# Patient Record
Sex: Female | Born: 1965 | Race: White | Hispanic: No | Marital: Single
Health system: Southern US, Community
[De-identification: ages and names within clinical notes are randomized; demographics above are authoritative.]

---

## 2015-08-06 ENCOUNTER — Encounter: Payer: Self-pay | Admitting: Emergency Medicine

## 2015-08-06 ENCOUNTER — Emergency Department
Admission: EM | Admit: 2015-08-06 | Discharge: 2015-08-06 | Disposition: A | Discharge status: Home / Self Care | Payer: Self-pay | Source: Home / Self Care | Attending: Family Medicine | Admitting: Family Medicine

## 2015-08-06 DIAGNOSIS — S61412A Laceration without foreign body of left hand, initial encounter: Secondary | ICD-10-CM

## 2015-08-06 DIAGNOSIS — Z23 Encounter for immunization: Secondary | ICD-10-CM

## 2015-08-06 MED ORDER — TETANUS-DIPHTH-ACELL PERTUSSIS 5-2.5-18.5 LF-MCG/0.5 IM SUSP
0.5000 mL | Freq: Once | INTRAMUSCULAR | Status: AC
Start: 2015-08-06 — End: 2015-08-06
  Administered 2015-08-06: 0.5 mL via INTRAMUSCULAR

## 2015-08-06 MED ORDER — IBUPROFEN 600 MG PO TABS: 600.0000 mg | ORAL_TABLET | Freq: Four times a day (QID) | ORAL | Status: DC | PRN

## 2015-08-06 MED ORDER — TRAMADOL HCL 50 MG PO TABS: 50.0000 mg | ORAL_TABLET | Freq: Four times a day (QID) | ORAL | Status: DC | PRN

## 2015-08-06 NOTE — ED Provider Notes (Signed)
CSN: [id number]     Arrival date & time 08/06/15  1648 History   First MD Initiated Contact with Patient 08/06/15 1714     Chief Complaint  Patient presents with  . Extremity Laceration   (Consider location/radiation/quality/duration/timing/severity/associated sxs/prior Treatment) HPI  Pt is a 49yo female presenting to [facility] with c/o laceration to her Left hand that occurred just PTA.  Pt states she was cutting some vinyl with a knife.  The knife slipped and she accidentally cut her Left hand.  Pt applied pressure with a towel. Bleeding controlled PTA.  No other injuries.  Pt is Right hand dominant. No pain medication PTA. Pain is aching and stinging, mild to moderate in severity. Tetanus is out of date. She is not on blood thinners.   History reviewed. No pertinent past medical history. History reviewed. No pertinent past surgical history. No family history on file. Social History  Substance Use Topics  . Smoking status: Unknown If Ever Smoked  . Smokeless tobacco: None  . Alcohol Use: None   OB History    No data available     Review of Systems  Musculoskeletal: Negative for myalgias, joint swelling and arthralgias.  Skin: Positive for wound. Negative for color change.       Left hand laceration  Neurological: Negative for weakness and numbness.    Allergies  Review of patient's allergies indicates no known allergies.  Home Medications   Prior to Admission medications   Medication Sig Start Date End Date Taking? Authorizing Provider  ibuprofen (ADVIL,MOTRIN) 600 MG tablet Take 1 tablet (600 mg total) by mouth every 6 (six) hours as needed. 08/06/15   Junius FinnerErin O'Malley, PA-C  traMADol (ULTRAM) 50 MG tablet Take 1 tablet (50 mg total) by mouth every 6 (six) hours as needed. 08/06/15   Junius FinnerErin O'Malley, PA-C   Meds Ordered and Administered this Visit   Medications  Tdap (BOOSTRIX) injection 0.5 mL (0.5 mLs Intramuscular Given 08/06/15 1727)    BP 114/77 mmHg  Pulse 76  Temp(Src)  98.2 F (36.8 C) (Oral)  Ht 5\' 5"  (1.651 m)  Wt 196 lb (88.905 kg)  BMI 32.62 kg/m2  SpO2 97%  LMP  (LMP Unknown) No data found.   Physical Exam  Constitutional: She is oriented to person, place, and time. She appears well-developed and well-nourished.  HENT:  Head: Normocephalic and atraumatic.  Eyes: EOM are normal.  Neck: Normal range of motion.  Cardiovascular: Normal rate.   Pulses:      Radial pulses are 2+ on the left side.  Left thumb: cap refill < 3 seconds  Pulmonary/Chest: Effort normal.  Musculoskeletal: Normal range of motion.  Left hand: laceration base of thumb (see skin exam). Full ROM Left thumb and index finger.   Neurological: She is alert and oriented to person, place, and time.  Left hand: mild numbness around wound edges. Distal sensation in tact.  Skin: Skin is warm and dry.  Left hand, dorsal aspect over 1st metacarpal: 3cm linear laceration with clean edges. Bleeding controlled. Tendon visible under fascia layer. Tendon is in tact.  No foreign bodies seen or palpated.   Psychiatric: She has a normal mood and affect. Her behavior is normal.  Nursing note and vitals reviewed.   ED Course  .Marland Kitchen.Laceration Repair Date/Time: 08/06/2015 5:31 PM Performed by: Junius Finner'MALLEY, Annasophia Crocker Authorized by: Donna ChristenBEESE, STEPHEN A Consent: Verbal consent obtained. Risks and benefits: risks, benefits and alternatives were discussed Consent given by: patient Patient understanding: patient states understanding of the  procedure being performed Patient consent: the patient's understanding of the procedure matches consent given Site marked: the operative site was marked Required items: required blood products, implants, devices, and special equipment available Patient identity confirmed: verbally with patient Time out: Immediately prior to procedure a "time out" was called to verify the correct patient, procedure, equipment, support staff and site/side marked as required. Body area: upper  extremity Location details: left hand Laceration length: 3 cm Foreign bodies: no foreign bodies Tendon involvement: none Nerve involvement: superficial Vascular damage: no Anesthesia: local infiltration Local anesthetic: lidocaine 2% without epinephrine Anesthetic total: 1.5 ml Patient sedated: no Preparation: Patient was prepped and draped in the usual sterile fashion. Irrigation solution: saline (diluted betadine ) Debridement: none Degree of undermining: none Skin closure: 4-0 Prolene Number of sutures: 6 Technique: simple Approximation: close Approximation difficulty: simple Dressing: 4x4 sterile gauze, antibiotic ointment and gauze roll Patient tolerance: Patient tolerated the procedure well with no immediate complications   (including critical care time)  Labs Review Labs Reviewed - No data to display  Imaging Review No results found.    MDM   1. Hand laceration, left, initial encounter    Pt presenting to [facility] with laceration to Left hand. No tendon involvement. Superficial nerve involvement with slight decreased sensation around wound edges. No foreign bodies seen or palpated. No indication for imaging at this time. Wound sutured closed w/o immediate complications Rx: tramadol and ibuprofen F/u in 4-5 days if not improving, sooner if signs of infection F/u in 10-14 days for suture removal if no complications. Patient verbalized understanding and agreement with treatment plan.    Junius Finner, PA-C 08/06/15 1806

## 2015-08-06 NOTE — ED Notes (Signed)
PT CUT HER LEFT THUMB WITH A KNIFE TODAY

## 2015-08-06 NOTE — Discharge Instructions (Signed)
Tramadol is strong pain medication. While taking, do not drink alcohol, drive, or perform any other activities that requires focus while taking these medications.   You should at least take ibuprofen tonight before bed to help prevent pain from waking you in the night as numbing medication will wear off.  Keep hand elevated on a pillow to help with pain and swelling. See below for further instructions.

## 2015-08-18 ENCOUNTER — Emergency Department (INDEPENDENT_AMBULATORY_CARE_PROVIDER_SITE_OTHER)
Admission: EM | Admit: 2015-08-18 | Discharge: 2015-08-18 | Disposition: A | Discharge status: Home / Self Care | Payer: Self-pay | Source: Home / Self Care | Attending: Family Medicine | Admitting: Family Medicine

## 2015-08-18 DIAGNOSIS — Z4802 Encounter for removal of sutures: Secondary | ICD-10-CM

## 2015-08-18 NOTE — ED Provider Notes (Signed)
CSN: [id number]     Arrival date & time 08/18/15  1047 History   First MD Initiated Contact with Patient 08/18/15 1047     Chief Complaint  Patient presents with  . Suture / Staple Removal   (Consider location/radiation/quality/duration/timing/severity/associated sxs/prior Treatment) HPI  Pt is a 49yo female [facility] after she had 6 sutures placed in Left hand on 08/06/15.  Pt initially cut her hand on a knife while cutting vinyl. Pt states wound has been healing well. Denies fever or chills. She notes mild redness at area of stitches as well as mild itching but denies drainage or bleeding from wound. No other concerns.  History reviewed. No pertinent past medical history. History reviewed. No pertinent past surgical history. No family history on file. Social History  Substance Use Topics  . Smoking status: Unknown If Ever Smoked  . Smokeless tobacco: None  . Alcohol Use: None   OB History    No data available     Review of Systems  Constitutional: Negative for fever and chills.  Musculoskeletal: Negative for myalgias, joint swelling and arthralgias.  Skin: Positive for color change and wound. Negative for rash.       Left hand  Neurological: Negative for weakness and numbness.    Allergies  Review of patient's allergies indicates no known allergies.  Home Medications   Prior to Admission medications   Medication Sig Start Date End Date Taking? Authorizing Provider  ibuprofen (ADVIL,MOTRIN) 600 MG tablet Take 1 tablet (600 mg total) by mouth every 6 (six) hours as needed. 08/06/15   Junius FinnerErin O'Malley, PA-C  traMADol (ULTRAM) 50 MG tablet Take 1 tablet (50 mg total) by mouth every 6 (six) hours as needed. 08/06/15   Junius FinnerErin O'Malley, PA-C   Meds Ordered and Administered this Visit  Medications - No data to display  BP 122/78 mmHg  Pulse 73  Temp(Src) 98.3 F (36.8 C) (Oral)  Ht 5\' 5"  (1.651 m)  Wt 196 lb (88.905 kg)  BMI 32.62 kg/m2  SpO2 96%   LMP  (LMP Unknown) No data found.   Physical Exam  Constitutional: She is oriented to person, place, and time. She appears well-developed and well-nourished.  HENT:  Head: Normocephalic and atraumatic.  Eyes: EOM are normal.  Neck: Normal range of motion.  Cardiovascular: Normal rate.   Pulmonary/Chest: Effort normal.  Musculoskeletal: Normal range of motion.  Left hand: full ROM all fingers (see skin exam)  Neurological: She is alert and oriented to person, place, and time.  Skin: Skin is warm and dry. No rash noted. No erythema.  Left hand, dorsal aspect, base of thumb: well healing laceration, 6 interrupted sutures in place.  Pink skin. No bleeding or discharge. Non-tender.  Psychiatric: She has a normal mood and affect. Her behavior is normal.  Nursing note and vitals reviewed.   ED Course  .Suture Removal Date/Time: 08/18/2015 11:22 AM Performed by: Junius Finner'MALLEY, Minnetta Sandora Authorized by: Donna ChristenBEESE, STEPHEN A Consent: Verbal consent obtained. Risks and benefits: risks, benefits and alternatives were discussed Consent given by: patient Patient understanding: patient states understanding of the procedure being performed Site marked: the operative site was marked Required items: required blood products, implants, devices, and special equipment available Patient identity confirmed: verbally with patient Time out: Immediately prior to procedure a "time out" was called to verify the correct patient, procedure, equipment, support staff and site/side marked as required. Body area: upper extremity Location details: left hand Wound Appearance: pink and clean Sutures  Removed: 6 Post-removal: dressing applied and Steri-Strips applied Facility: sutures placed in this facility Patient tolerance: Patient tolerated the procedure well with no immediate complications   (including critical care time)  Labs Review Labs Reviewed - No data to display  Imaging Review No results found.     MDM    1. Visit for suture removal     Pt here for suture removal. Six sutures placed in Left hand on 08/06/15.  Wound appears to be healing well. Sutures removed w/o immediate complications. Two steri strips placed. Home care instructions provided. F/u as needed if signs of infection. Patient verbalized understanding and agreement with treatment plan.    Junius Finner, PA-C 08/18/15 1147

## 2015-08-18 NOTE — ED Notes (Signed)
Sutures on November 6th, back for suture removal.  No drainage, slight redness from healing noted.

## 2015-11-20 ENCOUNTER — Emergency Department
Admission: EM | Admit: 2015-11-20 | Discharge: 2015-11-20 | Disposition: A | Discharge status: Home / Self Care | Payer: Worker's Compensation | Source: Home / Self Care | Attending: Family Medicine | Admitting: Family Medicine

## 2015-11-20 ENCOUNTER — Emergency Department (INDEPENDENT_AMBULATORY_CARE_PROVIDER_SITE_OTHER): Payer: Self-pay

## 2015-11-20 DIAGNOSIS — S82401A Unspecified fracture of shaft of right fibula, initial encounter for closed fracture: Secondary | ICD-10-CM | POA: Diagnosis not present

## 2015-11-20 DIAGNOSIS — X58XXXA Exposure to other specified factors, initial encounter: Secondary | ICD-10-CM

## 2015-11-20 DIAGNOSIS — M25571 Pain in right ankle and joints of right foot: Secondary | ICD-10-CM

## 2015-11-20 DIAGNOSIS — W010XXA Fall on same level from slipping, tripping and stumbling without subsequent striking against object, initial encounter: Secondary | ICD-10-CM

## 2015-11-20 DIAGNOSIS — S82431A Displaced oblique fracture of shaft of right fibula, initial encounter for closed fracture: Secondary | ICD-10-CM

## 2015-11-20 DIAGNOSIS — W1839XA Other fall on same level, initial encounter: Secondary | ICD-10-CM | POA: Diagnosis not present

## 2015-11-20 MED ORDER — HYDROCODONE-ACETAMINOPHEN 5-325 MG PO TABS: 1.0000 | ORAL_TABLET | Freq: Four times a day (QID) | ORAL | Status: DC | PRN

## 2015-11-20 NOTE — Discharge Instructions (Signed)
°  Norco/Vicodin (hydrocodone-acetaminophen) is a narcotic pain medication, do not combine these medications with others containing tylenol. While taking, do not drink alcohol, drive, or perform any other activities that requires focus while taking these medications.  ° °

## 2015-11-20 NOTE — ED Notes (Signed)
Pt was walking today and tripped, ankle hit the curb, then she fell in the grass.  Right ankle swollen, bruised, most of the pain is on the lateral foot below the ankle.

## 2015-11-20 NOTE — ED Provider Notes (Signed)
CSN: [id number]     Arrival date & time 11/20/15  1853 History   First MD Initiated Contact with Patient 11/20/15 1902     Chief Complaint  Patient presents with  . Ankle Pain    Right   (Consider location/radiation/quality/duration/timing/severity/associated sxs/prior Treatment) HPI The pt is a 50yo female presenting to [facility] with c/o Right ankle and foot pain and swelling that has gradually worsened throughout the day. Symptoms started this morning after she tripped and fell hitting her foot into a curb this morning while walking for exercise.  She fell onto her Right side into grass but denies any other injuries.  Pain is aching and throbbing, moderate in severity but she notes she has been able to walk on it and move her foot. Pain is severe, 10/10 when walking on it. Does not believe it is broken but she is unsure.  Ibuprofen taken earlier today.   History reviewed. No pertinent past medical history. History reviewed. No pertinent past surgical history. No family history on file. Social History  Substance Use Topics  . Smoking status: Unknown If Ever Smoked  . Smokeless tobacco: None  . Alcohol Use: None   OB History    No data available     Review of Systems  Musculoskeletal: Positive for myalgias, joint swelling and arthralgias.       Right ankle and foot  Skin: Positive for color change. Negative for wound.  Neurological: Negative for weakness and numbness.    Allergies  Review of patient's allergies indicates no known allergies.  Home Medications   Prior to Admission medications   Medication Sig Start Date End Date Taking? Authorizing Provider  HYDROcodone-acetaminophen (NORCO/VICODIN) 5-325 MG tablet Take 1 tablet by mouth every 6 (six) hours as needed for moderate pain or severe pain. 11/20/15   Junius Finner, PA-C  ibuprofen (ADVIL,MOTRIN) 600 MG tablet Take 1 tablet (600 mg total) by mouth every 6 (six) hours as needed. 08/06/15   Junius Finner, PA-C  traMADol  (ULTRAM) 50 MG tablet Take 1 tablet (50 mg total) by mouth every 6 (six) hours as needed. 08/06/15   Junius Finner, PA-C   Meds Ordered and Administered this Visit  Medications - No data to display  BP 119/82 mmHg  Pulse 91  Temp(Src) 98.3 F (36.8 C) (Oral)  Ht  (1.651 m)  Wt 196 lb 8 oz (89.132 kg)  BMI 32.70 kg/m2  SpO2 97%  LMP 10/11/2015 No data found.   Physical Exam  Constitutional: She is oriented to person, place, and time. She appears well-developed and well-nourished.  HENT:  Head: Normocephalic and atraumatic.  Eyes: EOM are normal.  Neck: Normal range of motion.  Cardiovascular: Normal rate.   Pulses:      Dorsalis pedis pulses are 2+ on the right side.  Pulmonary/Chest: Effort normal.  Musculoskeletal: Normal range of motion. She exhibits edema and tenderness.  Right ankle and foot: moderate edema, worse on Lateral aspect. Tender to touch. 4/5 strength against resistance compared to Left foot. Calf is soft, non-tender.  Neurological: She is alert and oriented to person, place, and time.  Right foot: normal sensation  Skin: Skin is warm and dry.  Right foot and ankle: skin in tact. Mild ecchymosis to lateral plantar aspect.   Psychiatric: She has a normal mood and affect. Her behavior is normal.  Nursing note and vitals reviewed.   ED Course  Procedures (including critical care time)  Labs Review Labs Reviewed - No data to  display  Imaging Review Dg Ankle Complete Right  11/20/2015  CLINICAL DATA:  Right ankle pain, fall this a.m., twisted right ankle off the curb EXAM: RIGHT ANKLE - COMPLETE 3+ VIEW COMPARISON:  None. FINDINGS: Three views of the right ankle submitted. There is minimal displaced oblique fracture in distal right fibula. Ankle mortise is preserved. Plantar spur of calcaneus is noted. Lateral soft tissue swelling. IMPRESSION: Minimal displaced oblique fracture in distal right fibula. Plantar spur of calcaneus. Ankle mortise is preserved.  Lateral ankle soft tissue swelling. Electronically Signed   By: Natasha Mead M.D.   On: 11/20/2015 19:30   Dg Foot Complete Right  11/20/2015  CLINICAL DATA:  Fall, twisted right ankle off the curb EXAM: RIGHT FOOT COMPLETE - 3+ VIEW COMPARISON:  Right ankle same day. FINDINGS: Three views of the right foot submitted. No foot fracture or subluxation. Minimal displaced oblique fracture in distal right fibula. IMPRESSION: Minimal displaced oblique fracture in distal right fibula. No foot fracture or subluxation. Electronically Signed   By: Natasha Mead M.D.   On: 11/20/2015 19:31      MDM   1. Right fibular fracture, closed, initial encounter   2. Right ankle pain   3. Fall from slip, trip, or stumble, initial encounter    Pt c/o Right foot and ankle pain and swelling after trip and fall this morning. PMS in tact. Skin in tact.  Plain films: minimal displaced oblique fracture in distal Right fibula. Plantar spur of calcaneous noted. Ankle mortise is preserved.    Pt placed in Cam walker boot, crutches provided. Pt unsure if she will need strong pain medication. Small amount provided as pain will likely be worse within first 2 days. May take at night.  Rx: Norco May take ibuprofen  she has left over from laceration on Left thumb 3 months ago. Encouraged to call to schedule f/u appointment with Sports Medicine, Dr. Benjamin Stain, or Dr. Denyse Amass for continued management of Right ankle fracture. Patient verbalized understanding and agreement with treatment plan.     Junius Finner, PA-C 11/20/15 [phone]

## 2015-11-21 ENCOUNTER — Encounter: Payer: Self-pay | Admitting: Family Medicine

## 2015-11-21 ENCOUNTER — Ambulatory Visit (INDEPENDENT_AMBULATORY_CARE_PROVIDER_SITE_OTHER): Payer: Worker's Compensation | Admitting: Family Medicine

## 2015-11-21 VITALS — BP 126/74 | HR 82 | Wt 197.0 lb

## 2015-11-21 DIAGNOSIS — S82891A Other fracture of right lower leg, initial encounter for closed fracture: Secondary | ICD-10-CM

## 2015-11-21 NOTE — Assessment & Plan Note (Signed)
Nondisplaced. This is a candidate for conservative management. Splinting today. Nonweightbearing status. Recheck in 1 week. At that time will use a short leg cast. Recommend knee walker.

## 2015-11-21 NOTE — Patient Instructions (Signed)
Thank you for coming in today. Return in 1 week.  Do not walk on the splint.  Return sooner if needed.   Cast or Splint Care Casts and splints support injured limbs and keep bones from moving while they heal. It is important to care for your cast or splint at home.  HOME CARE INSTRUCTIONS  Keep the cast or splint uncovered during the drying period. It can take 24 to 48 hours to dry if it is made of plaster. A fiberglass cast will dry in less than 1 hour.  Do not rest the cast on anything harder than a pillow for the first 24 hours.  Do not put weight on your injured limb or apply pressure to the cast until your health care provider gives you permission.  Keep the cast or splint dry. Wet casts or splints can lose their shape and may not support the limb as well. A wet cast that has lost its shape can also create harmful pressure on your skin when it dries. Also, wet skin can become infected.  Cover the cast or splint with a plastic bag when bathing or when out in the rain or snow. If the cast is on the trunk of the body, take sponge baths until the cast is removed.  If your cast does become wet, dry it with a towel or a blow dryer on the cool setting only.  Keep your cast or splint clean. Soiled casts may be wiped with a moistened cloth.  Do not place any hard or soft foreign objects under your cast or splint, such as cotton, toilet paper, lotion, or powder.  Do not try to scratch the skin under the cast with any object. The object could get stuck inside the cast. Also, scratching could lead to an infection. If itching is a problem, use a blow dryer on a cool setting to relieve discomfort.  Do not trim or cut your cast or remove padding from inside of it.  Exercise all joints next to the injury that are not immobilized by the cast or splint. For example, if you have a long leg cast, exercise the hip joint and toes. If you have an arm cast or splint, exercise the shoulder, elbow, thumb,  and fingers.  Elevate your injured arm or leg on 1 or 2 pillows for the first 1 to 3 days to decrease swelling and pain.It is best if you can comfortably elevate your cast so it is higher than your heart. SEEK MEDICAL CARE IF:   Your cast or splint cracks.  Your cast or splint is too tight or too loose.  You have unbearable itching inside the cast.  Your cast becomes wet or develops a soft spot or area.  You have a bad smell coming from inside your cast.  You get an object stuck under your cast.  Your skin around the cast becomes red or raw.  You have new pain or worsening pain after the cast has been applied. SEEK IMMEDIATE MEDICAL CARE IF:   You have fluid leaking through the cast.  You are unable to move your fingers or toes.  You have discolored (blue or white), cool, painful, or very swollen fingers or toes beyond the cast.  You have tingling or numbness around the injured area.  You have severe pain or pressure under the cast.  You have any difficulty with your breathing or have shortness of breath.  You have chest pain.   This information is not  intended to replace advice given to you by your health care provider. Make sure you discuss any questions you have with your health care provider.   Document Released: 09/13/2000 Document Revised: 07/07/2013 Document Reviewed: 03/25/2013 Elsevier Interactive Patient Education [employer]

## 2015-11-21 NOTE — Progress Notes (Signed)
   Subjective:    I'm seeing this patient as a consultation for:  Junius Finner PA-C  CC: Right Ankle Fracture  HPI: Patient fell at work yesterday resulting in an ankle fracture. She was seen in urgent care on the 20th where she was diagnosed with a fracture. She was placed into an ankle brace and given crutches and nonweightbearing status. She notes moderate pain for which she takes her existing tramadol prescription. She feels well overall. She is not driving currently. Fevers chills nausea vomiting or diarrhea.  Past medical history, Surgical history, Family history not pertinant except as noted below, Social history, Allergies, and medications have been entered into the medical record, reviewed, and no changes needed.   Review of Systems: No headache, visual changes, nausea, vomiting, diarrhea, constipation, dizziness, abdominal pain, skin rash, fevers, chills, night sweats, weight loss, swollen lymph nodes, body aches, joint swelling, muscle aches, chest pain, shortness of breath, mood changes, visual or auditory hallucinations.   Objective:    Filed Vitals:   11/21/15 0904  BP: 126/74  Pulse: 82   General: Well Developed, well nourished, and in no acute distress.  Neuro/Psych: Alert and oriented x3, extra-ocular muscles intact, able to move all 4 extremities, sensation grossly intact. Skin: Warm and dry, no rashes noted.  Respiratory: Not using accessory muscles, speaking in full sentences, trachea midline.  Cardiovascular: Pulses palpable, no extremity edema. Abdomen: Does not appear distended. MSK: Right ankle swollen and tender lateral malleolus. Pulses capillary refill and sensation intact distally. Motion and stability not tested due to existing fracture.  Patient was placed into a posterior ankle splint with stirrups  No results found for this or any previous visit (from the past 24 hour(s)). Dg Ankle Complete Right  11/20/2015  CLINICAL DATA:  Right ankle pain, fall this  a.m., twisted right ankle off the curb EXAM: RIGHT ANKLE - COMPLETE 3+ VIEW COMPARISON:  None. FINDINGS: Three views of the right ankle submitted. There is minimal displaced oblique fracture in distal right fibula. Ankle mortise is preserved. Plantar spur of calcaneus is noted. Lateral soft tissue swelling. IMPRESSION: Minimal displaced oblique fracture in distal right fibula. Plantar spur of calcaneus. Ankle mortise is preserved. Lateral ankle soft tissue swelling. Electronically Signed   By: Natasha Mead M.D.   On: 11/20/2015 19:30   Dg Foot Complete Right  11/20/2015  CLINICAL DATA:  Fall, twisted right ankle off the curb EXAM: RIGHT FOOT COMPLETE - 3+ VIEW COMPARISON:  Right ankle same day. FINDINGS: Three views of the right foot submitted. No foot fracture or subluxation. Minimal displaced oblique fracture in distal right fibula. IMPRESSION: Minimal displaced oblique fracture in distal right fibula. No foot fracture or subluxation. Electronically Signed   By: Natasha Mead M.D.   On: 11/20/2015 19:31    Impression and Recommendations:   This case required medical decision making of moderate complexity.

## 2015-11-24 ENCOUNTER — Encounter: Payer: Self-pay | Admitting: Family Medicine

## 2015-11-24 ENCOUNTER — Telehealth: Payer: Self-pay

## 2015-11-24 NOTE — Telephone Encounter (Signed)
Pt.notified

## 2015-11-24 NOTE — Telephone Encounter (Signed)
Note written

## 2015-11-24 NOTE — Telephone Encounter (Signed)
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 She would like to pick this up today. Please advise.

## 2015-11-28 ENCOUNTER — Ambulatory Visit (INDEPENDENT_AMBULATORY_CARE_PROVIDER_SITE_OTHER): Payer: Worker's Compensation

## 2015-11-28 ENCOUNTER — Encounter: Payer: Self-pay | Admitting: Family Medicine

## 2015-11-28 ENCOUNTER — Ambulatory Visit (INDEPENDENT_AMBULATORY_CARE_PROVIDER_SITE_OTHER): Payer: Worker's Compensation | Admitting: Family Medicine

## 2015-11-28 VITALS — BP 140/86 | HR 92 | Wt 190.0 lb

## 2015-11-28 DIAGNOSIS — S82891A Other fracture of right lower leg, initial encounter for closed fracture: Secondary | ICD-10-CM

## 2015-11-28 DIAGNOSIS — S82431D Displaced oblique fracture of shaft of right fibula, subsequent encounter for closed fracture with routine healing: Secondary | ICD-10-CM

## 2015-11-28 DIAGNOSIS — X58XXXD Exposure to other specified factors, subsequent encounter: Secondary | ICD-10-CM

## 2015-11-28 NOTE — Patient Instructions (Signed)
Thank you for coming in today. Return in 2 weeks.   Cast or Splint Care Casts and splints support injured limbs and keep bones from moving while they heal. It is important to care for your cast or splint at home.  HOME CARE INSTRUCTIONS  Keep the cast or splint uncovered during the drying period. It can take 24 to 48 hours to dry if it is made of plaster. A fiberglass cast will dry in less than 1 hour.  Do not rest the cast on anything harder than a pillow for the first 24 hours.  Do not put weight on your injured limb or apply pressure to the cast until your health care provider gives you permission.  Keep the cast or splint dry. Wet casts or splints can lose their shape and may not support the limb as well. A wet cast that has lost its shape can also create harmful pressure on your skin when it dries. Also, wet skin can become infected.  Cover the cast or splint with a plastic bag when bathing or when out in the rain or snow. If the cast is on the trunk of the body, take sponge baths until the cast is removed.  If your cast does become wet, dry it with a towel or a blow dryer on the cool setting only.  Keep your cast or splint clean. Soiled casts may be wiped with a moistened cloth.  Do not place any hard or soft foreign objects under your cast or splint, such as cotton, toilet paper, lotion, or powder.  Do not try to scratch the skin under the cast with any object. The object could get stuck inside the cast. Also, scratching could lead to an infection. If itching is a problem, use a blow dryer on a cool setting to relieve discomfort.  Do not trim or cut your cast or remove padding from inside of it.  Exercise all joints next to the injury that are not immobilized by the cast or splint. For example, if you have a long leg cast, exercise the hip joint and toes. If you have an arm cast or splint, exercise the shoulder, elbow, thumb, and fingers.  Elevate your injured arm or leg on 1 or  2 pillows for the first 1 to 3 days to decrease swelling and pain.It is best if you can comfortably elevate your cast so it is higher than your heart. SEEK MEDICAL CARE IF:   Your cast or splint cracks.  Your cast or splint is too tight or too loose.  You have unbearable itching inside the cast.  Your cast becomes wet or develops a soft spot or area.  You have a bad smell coming from inside your cast.  You get an object stuck under your cast.  Your skin around the cast becomes red or raw.  You have new pain or worsening pain after the cast has been applied. SEEK IMMEDIATE MEDICAL CARE IF:   You have fluid leaking through the cast.  You are unable to move your fingers or toes.  You have discolored (blue or white), cool, painful, or very swollen fingers or toes beyond the cast.  You have tingling or numbness around the injured area.  You have severe pain or pressure under the cast.  You have any difficulty with your breathing or have shortness of breath.  You have chest pain.   This information is not intended to replace advice given to you by your health care provider.   Make sure you discuss any questions you have with your health care provider.   Document Released: 09/13/2000 Document Revised: 07/07/2013 Document Reviewed: 03/25/2013 Elsevier Interactive Patient Education 2016 [employer]  

## 2015-11-28 NOTE — Assessment & Plan Note (Signed)
Casting today. Continue nonweightbearing status. Recheck in 2 weeks.

## 2015-11-28 NOTE — Progress Notes (Signed)
       Whitney Flores is a 50 y.o. female who presents to [facility] today for follow-up right ankle fracture. Patient was seen originally on February 20th for fracture involving her right ankle. On the 21st she was placed into a posterior leg splint. She's here today for recheck. She feels well and notes some mild throbbing and pain. No radiating pain weakness or numbness fevers or chills. She has been nonweightbearing and using crutches.   No past medical history on file. No past surgical history on file. Social History  Substance Use Topics  . Smoking status: Unknown If Ever Smoked  . Smokeless tobacco: Not on file  . Alcohol Use: Not on file   family history is not on file.  ROS as above Medications: No current outpatient prescriptions on file.   No current facility-administered medications for this visit.   No Known Allergies   Exam:  BP 140/86 mmHg  Pulse 92  Wt 190 lb (86.183 kg)  LMP 10/11/2015 Gen: Well NAD Right ankle: Ecchymosis and swelling present. Pulses capillary refill and sensation intact distally.  Patient was placed into a well fitted well formed short leg nonweightbearing cast.  No results found for this or any previous visit (from the past 24 hour(s)). Dg Ankle Complete Right  11/28/2015  CLINICAL DATA:  Follow-up distal fibular fracture from November 20, 2015; persistent pain and swelling. EXAM: RIGHT ANKLE - COMPLETE 3+ VIEW COMPARISON:  Right ankle series of November 20, 2015. FINDINGS: The distal fibular meta diaphyseal fracture is again demonstrated. There is no significant periosteal reaction. The fracture line remains relatively sharp. The ankle joint mortise is preserved. The talar dome is intact. There is mild diffuse soft tissue swelling. IMPRESSION: Little change in the appearance of the distal fibular meta diaphyseal fracture. There is no significant  periosteal reaction. Electronically Signed   By: David  [place] M.D.   On: 11/28/2015 08:51     Please see individual assessment and plan sections.

## 2015-11-29 NOTE — Progress Notes (Signed)
Quick Note:  Xray looks stable. ______

## 2015-12-12 ENCOUNTER — Encounter: Payer: Self-pay | Admitting: Family Medicine

## 2015-12-12 ENCOUNTER — Ambulatory Visit (INDEPENDENT_AMBULATORY_CARE_PROVIDER_SITE_OTHER): Payer: Worker's Compensation

## 2015-12-12 ENCOUNTER — Ambulatory Visit (INDEPENDENT_AMBULATORY_CARE_PROVIDER_SITE_OTHER): Payer: Worker's Compensation | Admitting: Family Medicine

## 2015-12-12 VITALS — BP 125/73 | HR 85 | Wt 195.0 lb

## 2015-12-12 DIAGNOSIS — S82891A Other fracture of right lower leg, initial encounter for closed fracture: Secondary | ICD-10-CM

## 2015-12-12 DIAGNOSIS — X58XXXD Exposure to other specified factors, subsequent encounter: Secondary | ICD-10-CM

## 2015-12-12 DIAGNOSIS — S82831D Other fracture of upper and lower end of right fibula, subsequent encounter for closed fracture with routine healing: Secondary | ICD-10-CM

## 2015-12-12 LAB — COMPREHENSIVE METABOLIC PANEL
ALT: 16 U/L (ref 6–29)
AST: 15 U/L (ref 10–35)
Albumin: 4.1 g/dL (ref 3.6–5.1)
Alkaline Phosphatase: 95 U/L (ref 33–130)
BILIRUBIN TOTAL: 0.5 mg/dL (ref 0.2–1.2)
BUN: 16 mg/dL (ref 7–25)
CALCIUM: 9.8 mg/dL (ref 8.6–10.4)
CO2: 28 mmol/L (ref 20–31)
Chloride: 104 mmol/L (ref 98–110)
Creat: 0.72 mg/dL (ref 0.50–1.05)
GLUCOSE: 106 mg/dL — AB (ref 65–99)
Potassium: 5.3 mmol/L (ref 3.5–5.3)
SODIUM: 141 mmol/L (ref 135–146)
Total Protein: 6.8 g/dL (ref 6.1–8.1)

## 2015-12-12 MED ORDER — IBUPROFEN 800 MG PO TABS: 800.0000 mg | ORAL_TABLET | Freq: Three times a day (TID) | ORAL | Status: AC | PRN

## 2015-12-12 NOTE — Assessment & Plan Note (Signed)
Doing well. Cast reapplied. Continue nonweight bearing. Recheck in 2 weeks. Check CMP and vitamin D and bone density.

## 2015-12-12 NOTE — Progress Notes (Signed)
       Whitney Flores is a 50 y.o. female who [facility] today for follow-up right ankle fracture. Patient was originally seen on February 20 which she was diagnosed with an ankle fracture. She was placed into a long-leg splint. On February 28 she was seen again the fracture was stable and she was placed into a nonweightbearing fiberglass cast. She is here for recheck and feels well with minimal pain.   No past medical history on file. No past surgical history on file. Social History  Substance Use Topics  . Smoking status: Unknown If Ever Smoked  . Smokeless tobacco: Not on file  . Alcohol Use: Not on file   family history is not on file.  ROS as above Medications: Current Outpatient Prescriptions  Medication Sig Dispense Refill  . ibuprofen (ADVIL,MOTRIN) 800 MG tablet Take 1 tablet (800 mg total) by mouth every 8 (eight) hours as needed. 60 tablet 2   No current facility-administered medications for this visit.   No Known Allergies   Exam:  BP 125/73 mmHg  Pulse 85  Wt 195 lb (88.451 kg)  LMP 10/11/2015 Gen: Well NAD Right ankle well-appearing. Minimally tender over the lateral malleolus. Pulses capillary refill and sensation intact distally.  New circular fiberglass nonweightbearing short-leg cast was applied   No results found for this or any previous visit (from the past 24 hour(s)). Dg Ankle Complete Right  12/12/2015  CLINICAL DATA:  Follow-up right fibular fracture, subsequent encounter EXAM: RIGHT ANKLE - COMPLETE 3+ VIEW COMPARISON:  11/28/2015 FINDINGS: The oblique distal right fibular fracture is again identified. Very mild displacement is again seen. No significant callus formation is noted at this time. No gross soft tissue abnormality or acute bony abnormality is seen. Calcaneal spur is noted. IMPRESSION: Stable distal right fibular fracture  Electronically Signed   By: Alcide CleverMark  Lukens M.D.   On: 12/12/2015 09:25     Please see individual assessment and plan sections.

## 2015-12-12 NOTE — Progress Notes (Signed)
Quick Note:  Xray looks good ______ 

## 2015-12-12 NOTE — Patient Instructions (Addendum)
Thank you for coming in today. Return in 2 weeks.  Get labs and dexa scan.

## 2015-12-13 LAB — VITAMIN D 25 HYDROXY (VIT D DEFICIENCY, FRACTURES): Vit D, 25-Hydroxy: 25 ng/mL — ABNORMAL LOW (ref 30–100)

## 2015-12-13 NOTE — Progress Notes (Signed)
Quick Note:  Vitamin D deficiency noted. Take 5000 units of vitamin D daily over-the-counter.   ______

## 2015-12-26 ENCOUNTER — Ambulatory Visit (INDEPENDENT_AMBULATORY_CARE_PROVIDER_SITE_OTHER): Payer: Worker's Compensation

## 2015-12-26 ENCOUNTER — Ambulatory Visit (INDEPENDENT_AMBULATORY_CARE_PROVIDER_SITE_OTHER): Payer: Worker's Compensation | Admitting: Family Medicine

## 2015-12-26 ENCOUNTER — Encounter: Payer: Self-pay | Admitting: Family Medicine

## 2015-12-26 ENCOUNTER — Ambulatory Visit: Payer: Self-pay | Admitting: Family Medicine

## 2015-12-26 VITALS — BP 147/79 | HR 87 | Wt 200.0 lb

## 2015-12-26 DIAGNOSIS — S82891A Other fracture of right lower leg, initial encounter for closed fracture: Secondary | ICD-10-CM | POA: Diagnosis not present

## 2015-12-26 DIAGNOSIS — S82831D Other fracture of upper and lower end of right fibula, subsequent encounter for closed fracture with routine healing: Secondary | ICD-10-CM | POA: Diagnosis not present

## 2015-12-26 DIAGNOSIS — X58XXXD Exposure to other specified factors, subsequent encounter: Secondary | ICD-10-CM

## 2015-12-26 NOTE — Progress Notes (Signed)
       Whitney CoveyGina Flores is a 50 y.o. female who [facility] today for follow-up ankle fracture. Patient was originally seen on 11/20/2015 where she was diagnosed with a minimally displaced distal fibular fracture.  She feels well. She denies any severe pain fevers or chills.   No past medical history on file. No past surgical history on file. Social History  Substance Use Topics  . Smoking status: Unknown If Ever Smoked  . Smokeless tobacco: Not on file  . Alcohol Use: Not on file   family history is not on file.  ROS as above Medications: Current Outpatient Prescriptions  Medication Sig Dispense Refill  . ibuprofen (ADVIL,MOTRIN) 800 MG tablet Take 1 tablet (800 mg total) by mouth every 8 (eight) hours as needed. 60 tablet 2   No current facility-administered medications for this visit.   No Known Allergies   Exam:  BP 147/79 mmHg  Pulse 87  Wt 200 lb (90.719 kg)  LMP 10/02/2015 Gen: Well NAD Right ankle is well-appearing without significant swelling or ecchymosis. Mildly tender palpation overlying the lateral malleolus. Stability and motion not tested. Pulses capillary refill sensation intact.  No results found for this or any previous visit (from the past 24 hour(s)). Dg Ankle Complete Right  12/26/2015  CLINICAL DATA:  Follow-up right ankle fracture. EXAM: RIGHT ANKLE - COMPLETE 3+ VIEW COMPARISON:  December 12, 2015 FINDINGS: There is displaced fracture of the distal fibula not significantly changed compared to prior exam. There is no interval significant increase of callus formation. There is no dislocation. IMPRESSION: Displaced fracture of distal fibula not significantly changed compared to prior exam. Electronically Signed   By: Sherian ReinWei-Chen  Lin M.D.   On: 12/26/2015 11:19     Please see individual assessment and plan sections.

## 2015-12-26 NOTE — Progress Notes (Signed)
Quick Note:  Normal, no changes. ______ 

## 2015-12-26 NOTE — Patient Instructions (Signed)
Thank you for coming in today. Use the boot.  Do not bear weight if it hurts.  Return in 2 weeks.

## 2015-12-26 NOTE — Assessment & Plan Note (Signed)
Minimally displaced. Plan to transition to cam walker boot and nonweightbearing status. Recheck in 2 weeks.

## 2016-01-09 ENCOUNTER — Encounter: Payer: Self-pay | Admitting: Family Medicine

## 2016-01-09 ENCOUNTER — Ambulatory Visit (INDEPENDENT_AMBULATORY_CARE_PROVIDER_SITE_OTHER): Payer: Worker's Compensation

## 2016-01-09 ENCOUNTER — Ambulatory Visit (INDEPENDENT_AMBULATORY_CARE_PROVIDER_SITE_OTHER): Payer: Worker's Compensation | Admitting: Family Medicine

## 2016-01-09 VITALS — BP 124/73 | HR 80 | Wt 201.0 lb

## 2016-01-09 DIAGNOSIS — X58XXXD Exposure to other specified factors, subsequent encounter: Secondary | ICD-10-CM | POA: Diagnosis not present

## 2016-01-09 DIAGNOSIS — S82891A Other fracture of right lower leg, initial encounter for closed fracture: Secondary | ICD-10-CM | POA: Diagnosis not present

## 2016-01-09 DIAGNOSIS — S82431D Displaced oblique fracture of shaft of right fibula, subsequent encounter for closed fracture with routine healing: Secondary | ICD-10-CM

## 2016-01-09 NOTE — Assessment & Plan Note (Signed)
Doing well. Continue boot. Recheck April 21. We'll consider switching to ASO brace at that time.

## 2016-01-09 NOTE — Progress Notes (Signed)
   Whitney CoveyGina Flores is a 50 y.o. female who [facility] today for fracture management. Patient suffered a fracture to the right distal fibula on February 20. She diminished with immobilization since. She was last seen on March 28 where she was given a cam walker boot nonweightbearing status. She notes that she is essentially pain free and doing quite well.   No past medical history on file. No past surgical history on file. Social History  Substance Use Topics  . Smoking status: Unknown If Ever Smoked  . Smokeless tobacco: Not on file  . Alcohol Use: Not on file   family history is not on file.  ROS:  No headache, visual changes, nausea, vomiting, diarrhea, constipation, dizziness, abdominal pain, skin rash, fevers, chills, night sweats, weight loss, swollen lymph nodes, body aches, joint swelling, muscle aches, chest pain, shortness of breath, mood changes, visual or auditory hallucinations.    Medications: Current Outpatient Prescriptions  Medication Sig Dispense Refill  . ibuprofen (ADVIL,MOTRIN) 800 MG tablet Take 1 tablet (800 mg total) by mouth every 8 (eight) hours as needed. 60 tablet 2   No current facility-administered medications for this visit.   No Known Allergies   Exam:  BP 124/73 mmHg  Pulse 80  Wt 201 lb (91.173 kg)  LMP 10/02/2015 General: Well Developed, well nourished, and in no acute distress.  Neuro/Psych: Alert and oriented x3, extra-ocular muscles intact, able to move all 4 extremities, sensation grossly intact. Skin: Warm and dry, no rashes noted.  Respiratory: Not using accessory muscles, speaking in full sentences, trachea midline.  Cardiovascular: Pulses palpable, no extremity edema. Abdomen: Does not appear distended. MSK: Right ankle normal appearing minimally tender overlying the distal fibula. Motion is slightly decreased. Pulses capillary refill and sensation intact.   No results found for  this or any previous visit (from the past 24 hour(s)). Dg Ankle Complete Right  01/09/2016  CLINICAL DATA:  Lateral malleolar fracture.  Follow-up exam. EXAM: RIGHT ANKLE - COMPLETE 3+ VIEW COMPARISON:  None. FINDINGS: Slightly displaced distal fibular fracture is again noted. No significant change from prior exam. No significant callus formation. Medial malleolus is intact. No evidence of dislocation. Diffuse degenerative change. IMPRESSION: Slightly displaced distal fibular fracture again noted. No interim change. No significant callus formation. Electronically Signed   By: Maisie Fushomas  Register   On: 01/09/2016 09:34     Please see individual assessment and plan sections.

## 2016-01-09 NOTE — Patient Instructions (Signed)
Thank you for coming in today. Return in 2 weeks.  Continue the boot.  You do not need crutches if you do not hurt.  We will start PT soon.

## 2016-01-19 ENCOUNTER — Ambulatory Visit (INDEPENDENT_AMBULATORY_CARE_PROVIDER_SITE_OTHER): Payer: Worker's Compensation

## 2016-01-19 ENCOUNTER — Encounter: Payer: Self-pay | Admitting: Family Medicine

## 2016-01-19 ENCOUNTER — Ambulatory Visit (INDEPENDENT_AMBULATORY_CARE_PROVIDER_SITE_OTHER): Payer: Worker's Compensation | Admitting: Family Medicine

## 2016-01-19 VITALS — BP 131/75 | HR 85 | Wt 194.0 lb

## 2016-01-19 DIAGNOSIS — X58XXXD Exposure to other specified factors, subsequent encounter: Secondary | ICD-10-CM

## 2016-01-19 DIAGNOSIS — S82831D Other fracture of upper and lower end of right fibula, subsequent encounter for closed fracture with routine healing: Secondary | ICD-10-CM

## 2016-01-19 DIAGNOSIS — S82891A Other fracture of right lower leg, initial encounter for closed fracture: Secondary | ICD-10-CM

## 2016-01-19 NOTE — Patient Instructions (Signed)
Thank you for coming in today. We will try to use a bone stimulator.  We will also use an ankle brace for PT.  Return in 2-3 weeks.  Return sooner if needed

## 2016-01-19 NOTE — Progress Notes (Signed)
   Whitney CoveyGina Flores is a 50 y.o. female who [facility] today for follow-up right ankle fracture. Patient was originally seen on February 20th in urgent care for mildly displaced distal fibular fracture. Since then she's had immobilization. She's had weightbearing as tolerated in a cam walker boot since arch 28th. She feels well with minimal pain. She's tried walking a little bit out of the boot and notes some stiffness but minimal pain. She feels well.   No past medical history on file. No past surgical history on file. Social History  Substance Use Topics  . Smoking status: Unknown If Ever Smoked  . Smokeless tobacco: Not on file  . Alcohol Use: Not on file   family history is not on file.  ROS:  No headache, visual changes, nausea, vomiting, diarrhea, constipation, dizziness, abdominal pain, skin rash, fevers, chills, night sweats, weight loss, swollen lymph nodes, body aches, joint swelling, muscle aches, chest pain, shortness of breath, mood changes, visual or auditory hallucinations.    Medications: Current Outpatient Prescriptions  Medication Sig Dispense Refill  . ibuprofen (ADVIL,MOTRIN) 800 MG tablet Take 1 tablet (800 mg total) by mouth every 8 (eight) hours as needed. 60 tablet 2   No current facility-administered medications for this visit.   No Known Allergies   Exam:  BP 131/75 mmHg  Pulse 85  Wt 194 lb (87.998 kg)  LMP 10/02/2015 General: Well Developed, well nourished, and in no acute distress.  Neuro/Psych: Alert and oriented x3, extra-ocular muscles intact, able to move all 4 extremities, sensation grossly intact. Skin: Warm and dry, no rashes noted.  Respiratory: Not using accessory muscles, speaking in full sentences, trachea midline.  Cardiovascular: Pulses palpable, no extremity edema. Abdomen: Does not appear distended. MSK: Right ankle is normal-appearing. Nontender to palpation. Ligamentous  instability and motion testing not performed. Pulses capillary refill sensation intact distally.    No results found for this or any previous visit (from the past 24 hour(s)). Dg Ankle Complete Right  01/19/2016  CLINICAL DATA:  Follow-up ankle fracture EXAM: RIGHT ANKLE - COMPLETE 3+ VIEW COMPARISON:  Multiple previous exams the most recent which dated 01/09/2016 FINDINGS: Distal fibular fracture is again identified and not significantly changed. No new fractures seen. Calcaneal spur is again noted. No significant soft tissue abnormality is seen. IMPRESSION: Stable distal fibular fracture. Electronically Signed   By: Alcide CleverMark  Lukens M.D.   On: 01/19/2016 [id number]      50 year old woman status post 8 weeks right ankle fracture. Right ankle fracture. Clinically doing very well however no significant mineralization on x-ray.  I'm concerned that she may be developing a fibrous union. Plan for bone stimulator. Continued cam walker boot. Start physical therapy. Recheck in about 2-3 weeks.

## 2016-01-22 ENCOUNTER — Ambulatory Visit (INDEPENDENT_AMBULATORY_CARE_PROVIDER_SITE_OTHER): Payer: Worker's Compensation | Admitting: Physical Therapy

## 2016-01-22 ENCOUNTER — Encounter: Payer: Self-pay | Admitting: Physical Therapy

## 2016-01-22 DIAGNOSIS — R2689 Other abnormalities of gait and mobility: Secondary | ICD-10-CM

## 2016-01-22 DIAGNOSIS — M25671 Stiffness of right ankle, not elsewhere classified: Secondary | ICD-10-CM

## 2016-01-22 DIAGNOSIS — R600 Localized edema: Secondary | ICD-10-CM

## 2016-01-22 DIAGNOSIS — M25571 Pain in right ankle and joints of right foot: Secondary | ICD-10-CM | POA: Diagnosis not present

## 2016-01-22 DIAGNOSIS — M6281 Muscle weakness (generalized): Secondary | ICD-10-CM

## 2016-01-22 NOTE — Therapy (Signed)
[facility] [place] [address] [place], [place], [address] Phone: [phone]   Fax:  [phone]  Physical Therapy Evaluation  Patient Details  Name: Whitney Flores MRN: [id number] Date of Birth: 09/12/1966 Referring Provider: Dr Denyse Amass  Encounter Date: 01/22/2016      PT End of Session - 01/22/16 0929    Visit Number 1   Number of Visits 8   Date for PT Re-Evaluation 02/19/16  Patient will most likely require more treatment, this is the authorized number from [facility] to start out with   PT Start Time 0849   PT Stop Time 0943   PT Time Calculation (min) 54 min   Activity Tolerance Patient tolerated treatment well      History reviewed. No pertinent past medical history.  History reviewed. No pertinent past surgical history.  There were no vitals filed for this visit.       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  was casted x 8 wks and now using the CAM boot. Healing is slow and they are ordering her a bone stimulator.  She has a ASO for therapy.    How long can you walk comfortably? without boot household amb, with boot in the community   Diagnostic tests x-ray   Patient Stated Goals no pain, workout and exercise - was doing squats, sliding side to side on machine    Currently in Pain? Yes   Pain Score 7    Pain Location Ankle   Pain Orientation Right   Pain Descriptors / Indicators Aching   Pain Type Acute pain   Pain Onset More than a month ago   Pain Frequency Intermittent   Aggravating Factors  standing/walking without boot.    Pain Relieving Factors wearing CAM boot            [facility] PT Assessment - 01/22/16 0001    Assessment   Medical Diagnosis Rt ankle fracture - closed - fibula   Referring Provider Dr Denyse Amass   Onset Date/Surgical Date 11/20/15   Hand Dominance Right   Next MD Visit 02/13/16   Prior  Therapy none   Precautions   Precautions --  no lifting/pulling/pushing    Precaution Comments modified duties at work   Required Braces or Orthoses --  CAM boot with walking except for with PT ASO.    Restrictions   Weight Bearing Restrictions --  WBAT   Balance Screen   Has the patient fallen in the past 6 months Yes   How many times? 1  form the accident   Has the patient had a decrease in activity level because of a fear of falling?  No   Is the patient reluctant to leave their home because of a fear of falling?  No   Home Environment   Living Environment Private residence   Home Layout Two level  takes one at a time.    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    Observation/Other Assessments   Focus on Therapeutic Outcomes (FOTO)  63% limited   Posture/Postural Control   Posture Comments some edema in Rt lateral ankle.   pes planus   ROM / Strength   AROM / PROM / Strength AROM;PROM;Strength  AROM   AROM Assessment Site Hip;Knee;Ankle   Right/Left Hip --  WNL   Right/Left Knee --  WNL   Right/Left Ankle Right;Left   Right Ankle Dorsiflexion 6   Right Ankle Plantar Flexion 30   Right Ankle Inversion 35   Right Ankle Eversion 30   Left Ankle Dorsiflexion 15   PROM   PROM Assessment Site Hip;Knee;Ankle   Right/Left Ankle Right  Lt WNL   Right Ankle Dorsiflexion 11   Right Ankle Plantar Flexion 32   Strength   Strength Assessment Site Hip;Knee;Ankle   Right/Left Hip Right  Lt WNL   Right Hip Flexion 4/5   Right Hip Extension 4+/5   Right Hip ABduction 4-/5   Right/Left Knee Right  Lt WNL   Right Knee Flexion 4-/5   Right Knee Extension 4/5   Right/Left Ankle Right  Lt WNL   Right Ankle Dorsiflexion 4-/5   Right Ankle Plantar Flexion 3-/5   Right Ankle Inversion 4+/5   Right Ankle Eversion 4-/5    Palpation   Palpation comment hypomobile Rt talus minimal, and tarsals.    Ambulation/Gait   Ambulation/Gait Yes   Ambulation/Gait Assistance 7: Independent   Ambulation Distance (Feet) --  inthe gym   Gait Pattern Decreased step length - right;Decreased stance time - right;Decreased dorsiflexion - right;Antalgic;Lateral trunk lean to left                   [facility] Adult PT Treatment/Exercise - 01/22/16 0001    Exercises   Exercises Ankle   Modalities   Modalities Vasopneumatic   Vasopneumatic   Number Minutes Vasopneumatic  15 minutes   Vasopnuematic Location  Ankle  Rt   Vasopneumatic Pressure Medium   Vasopneumatic Temperature  3*   Ankle Exercises: Stretches   Soleus Stretch 1 rep;30 seconds   Gastroc Stretch 1 rep;30 seconds   Ankle Exercises: Seated   ABC's 1 rep   Ankle Circles/Pumps Right;10 reps   Other Seated Ankle Exercises inv/ever ROM                PT Education - 01/22/16 0923    Education provided Yes   Education Details HEP    Person(s) Educated Patient   Methods Explanation;Demonstration;Handout   Comprehension Returned demonstration;Verbalized understanding             PT Long Term Goals - 01/22/16 1148    PT LONG TERM GOAL #1   Title I with advanced HEP to include return to her normal exercise routine ( 02/19/16)    Time 4   Period Weeks   Status New   PT LONG TERM GOAL #2   Title increase Rt dorsiflexion =/> 15 degrees, ( 02/19/16)    Time 4   Period Weeks   Status New   PT LONG TERM GOAL #3   Title increase strength Rt LE =/> 5-/5 throughout ( 02/19/16)    Time 4   Period Weeks   Status New   PT LONG TERM GOAL #4   Title ambulate on stairs with alternating gait pattern. ( 02/19/16)    Time 4   Period Weeks   Status New   PT LONG TERM GOAL #5   Title improve FOTO =/< 38% limited ( 02/19/16)    Time 4   Period Weeks   Status New               Plan - 01/22/16 1146    Clinical Impression Statement 50 yo  female ~ 7 wks s/p Rt fibula fx, she has been immobilized and most recently placed in a CAM boot for walking, she is allowed to use an ASO during therapy.  She presents with decreased ankle ROM, decreased strength in her  Rt LE , gait abnormalities and edema. She is having slow healing of her fracture so she is to start using a bone stimulator.    Rehab Potential Excellent   PT Frequency 2x / week   PT Duration 4 weeks   PT Treatment/Interventions Ultrasound;Balance training;Neuromuscular re-education;Patient/family education;Passive range of motion;Gait training;Cryotherapy;Stair training;Dry needling;Electrical Stimulation;Moist Heat;Therapeutic exercise;Manual techniques;Vasopneumatic Device;Taping   PT Next Visit Plan progress to weightbearing proprioception exercise and LE strengthening.    Consulted and Agree with Plan of Care Patient      Patient will benefit from skilled therapeutic intervention in order to improve the following deficits and impairments:  Increased edema, Decreased strength, Hypomobility, Pain, Abnormal gait, Decreased range of motion  Visit Diagnosis: Pain in right ankle and joints of right foot - Plan: PT plan of care cert/re-cert  Other abnormalities of gait and mobility - Plan: PT plan of care cert/re-cert  Localized edema - Plan: PT plan of care cert/re-cert  Stiffness of right ankle, not elsewhere classified - Plan: PT plan of care cert/re-cert  Muscle weakness (generalized) - Plan: PT plan of care cert/re-cert     Problem List Patient Active Problem List   Diagnosis Date Noted  . Ankle fracture, right 11/21/2015    Roderic Scarce PT 01/22/2016, 11:54 AM  [facility] [address] [place], [place], [address] Phone: [phone]   Fax:  [phone]  Name: Lanore Renderos MRN: [id number] Date of Birth: 03/30/1966

## 2016-01-22 NOTE — Patient Instructions (Signed)
ROM: Plantar / Dorsiflexion    With right leg relaxed, gently flex and extend ankle. Move through full range of motion. Avoid pain. Repeat __10__ times per set. Do __1__ sets per session. Do _2-3___ sessions per day.    Ankle Circles    Slowly rotate right foot and ankle clockwise then counterclockwise. Gradually increase range of motion. Avoid pain. Circle __10__ times each direction per set. Do __1__ sets per session. Do _2-3___ sessions per day.  Ankle Alphabet    Using left ankle and foot only, trace the letters of the alphabet. Perform A to Z. Repeat __1__ times per set. Do _1___ sets per session. Do _2-3___ sessions per day.   Gastroc Stretch    Stand with right foot back, leg straight, forward leg bent. Keeping heel on floor, turned slightly out, lean into wall until stretch is felt in calf. Hold __30__ seconds. Repeat __1__ times per set. Do __1__ sets per session. Do _2-3___ sessions per day.  Soleus Stretch    Stand with right foot back, both knees bent. Keeping heel on floor, turned slightly out, lean into wall until stretch is felt in lower calf. Hold __30__ seconds. Repeat __1__ times per set. Do __1__ sets per session. Do _2-3___ sessions per day.  ROM: Inversion / Eversion    With left leg relaxed, gently turn ankle and foot in and out. Move through full range of motion. Avoid pain. Repeat __10__ times per set. Do __1__ sets per session. Do __2-3__ sessions per day. Copyright  VHI. All rights reserved.

## 2016-01-25 ENCOUNTER — Ambulatory Visit (INDEPENDENT_AMBULATORY_CARE_PROVIDER_SITE_OTHER): Payer: Worker's Compensation | Admitting: Physical Therapy

## 2016-01-25 DIAGNOSIS — M25671 Stiffness of right ankle, not elsewhere classified: Secondary | ICD-10-CM

## 2016-01-25 DIAGNOSIS — M25571 Pain in right ankle and joints of right foot: Secondary | ICD-10-CM | POA: Diagnosis not present

## 2016-01-25 DIAGNOSIS — R600 Localized edema: Secondary | ICD-10-CM

## 2016-01-25 DIAGNOSIS — R2689 Other abnormalities of gait and mobility: Secondary | ICD-10-CM

## 2016-01-25 DIAGNOSIS — M6281 Muscle weakness (generalized): Secondary | ICD-10-CM

## 2016-01-25 NOTE — Therapy (Signed)
[facility] [address] [place], [place], [address] Phone: [phone]   Fax:  [phone]  Physical Therapy Treatment  Patient Details  Name: Whitney CoveyGina Flores MRN: [id number] Date of Birth: 1966/05/01 Referring Provider: Dr Denyse Amassorey  Encounter Date: 01/25/2016      PT End of Session - 01/25/16 0803    Visit Number 2   Number of Visits 8   Date for PT Re-Evaluation 02/19/16   PT Start Time 0800   PT Stop Time 0857   PT Time Calculation (min) 57 min   Activity Tolerance Patient tolerated treatment well;No increased pain      No past medical history on file.  No past surgical history on file.  There were no vitals filed for this visit.      Subjective Assessment - 01/25/16 0804    Subjective Pt ambulates with CAM boot, forgot to bring ASO and gym shoe.  Has been performing HEP 2x/day.     Patient Stated Goals no pain, workout and exercise - was doing squats, sliding side to side on machine    Currently in Pain? No/denies            [facility] - 01/25/16 0001    Assessment   Medical Diagnosis Rt ankle fracture - closed   AROM   Right Ankle Dorsiflexion 8   PROM   Right Ankle Dorsiflexion 11           [facility] Adult PT Treatment/Exercise - 01/25/16 0001    Exercises   Exercises Knee/Hip;Ankle   Knee/Hip Exercises: Standing   Other Standing Knee Exercises squats x 10 with focus on even weight beteween feet. then squats to black mat x 5 reps   Knee/Hip Exercises: Supine   Straight Leg Raises Right;1 set;10 reps   Straight Leg Raise with External Rotation Right;1 set;10 reps  on elbows, with hip abd/add   Knee/Hip Exercises: Sidelying   Hip ABduction Right;1 set;10 reps   Modalities   Modalities Vasopneumatic   Vasopneumatic   Number Minutes Vasopneumatic  15 minutes   Vasopnuematic Location  Ankle   Vasopneumatic Pressure Medium   Vasopneumatic Temperature  3*   Ankle Exercises: Stretches   Soleus Stretch 2 reps;20 seconds   Gastroc Stretch 30 seconds;4 reps   Gastroc Stretch Limitations seated x 2, then standing x 2 reps   Ankle Exercises: Standing   SLS Rt SLS x 30 sec, with horizontal head turns x 30 sec, then eyes closed x 15;  on blue pad x 2 trials.    Heel Raises 10 reps  2 sets   Toe Raise 10 reps   Ankle Exercises: Seated   ABC's 1 rep   Ankle Circles/Pumps Right;10 reps  CW/CCW   Other Seated Ankle Exercises Inversion/ eversion with red band x 10 reps x 2 sets (RLE)           PT Long Term Goals - 01/25/16 1005    PT LONG TERM GOAL #1   Title I with advanced HEP to include return to her normal exercise routine ( 02/19/16)    Time 4   Period Weeks   Status On-going   PT LONG TERM GOAL #2   Title increase Rt dorsiflexion =/> 15 degrees, ( 02/19/16)    Time 4   Period Weeks   Status On-going   PT LONG TERM GOAL #3   Title increase strength Rt LE =/> 5-/5 throughout ( 02/19/16)    Time 4  Period Weeks   Status On-going   PT LONG TERM GOAL #4   Title ambulate on stairs with alternating gait pattern. ( 02/19/16)    Time 4   Period Weeks   Status On-going   PT LONG TERM GOAL #5   Title improve FOTO =/< 38% limited ( 02/19/16)    Time 4   Period Weeks   Status On-going               Plan - 01/25/16 1003    Clinical Impression Statement Pt demonstrated improvement in Rt ankle DF. She tolerated all exercises well, with no increase in pain but slight increase in Rt ankle swelling.  Progressing towards established goals.  Pt will benefit from continued PT intervention to maximize functional mobility independence.    Rehab Potential Excellent   PT Frequency 2x / week   PT Duration 4 weeks   PT Treatment/Interventions Ultrasound;Balance training;Neuromuscular re-education;Patient/family education;Passive range of motion;Gait training;Cryotherapy;Stair training;Dry needling;Electrical Stimulation;Moist Heat;Therapeutic exercise;Manual  techniques;Vasopneumatic Device;Taping   PT Next Visit Plan Continue progressive strengthening, stretching and proprioceptive activities for RLE.   Consulted and Agree with Plan of Care Patient      Patient will benefit from skilled therapeutic intervention in order to improve the following deficits and impairments:  Increased edema, Decreased strength, Hypomobility, Pain, Abnormal gait, Decreased range of motion  Visit Diagnosis: No diagnosis found.     Problem List Patient Active Problem List   Diagnosis Date Noted  . Ankle fracture, right 11/21/2015   Mayer Camel, PTA 01/25/2016 10:12 AM  [facility] [place] [address] [place], [place], [address] Phone: [phone]   Fax:  [phone]  Name: Whitney Flores MRN: [id number] Date of Birth: Feb 09, 1966

## 2016-01-25 NOTE — Patient Instructions (Signed)
Inversion: Resisted   Cross legs with right leg underneath, foot in tubing loop. Hold tubing around other foot to resist and turn foot in. Repeat __10__ times per set. Do _2___ sets per session. Do _1__ sessions per day.  Eversion: Resisted   With right foot in tubing loop, hold tubing around other foot to resist and turn foot out. Repeat __10__ times per set. Do _2___ sets per session. Do __1__ sessions per day.   Dorsiflexion: Resisted   Facing anchor, tubing around left foot, pull toward face.  Repeat _10___ times per set. Do __2__ sets per session. Do __1__ sessions per day.  http://orth.[email or web address].[email or web address]   Balance: Unilateral - Foam    Eyes open, balance with right leg on dense foam. Hold __15-30__ seconds. Repeat _2___ times per set. Do _1___ sets per session. Do _1-2___ sessions per day. Perform exercise with eyes closed.  Mini Squat: Double Leg    With feet shoulder width apart, reach forward for balance and do a mini squat. Keep knees in line with second toe. Knees do not go past toes. Repeat __10_ times per set. Do _2__ sets per session.  [email or web address].exer.[email or web address]    [facility] [address] [place], [place] [address]  [phone]) [phone] (fax)

## 2016-01-29 ENCOUNTER — Encounter: Payer: Self-pay | Admitting: Physical Therapy

## 2016-01-29 ENCOUNTER — Ambulatory Visit (INDEPENDENT_AMBULATORY_CARE_PROVIDER_SITE_OTHER): Payer: Worker's Compensation | Admitting: Physical Therapy

## 2016-01-29 DIAGNOSIS — M25671 Stiffness of right ankle, not elsewhere classified: Secondary | ICD-10-CM | POA: Diagnosis not present

## 2016-01-29 DIAGNOSIS — R2689 Other abnormalities of gait and mobility: Secondary | ICD-10-CM

## 2016-01-29 DIAGNOSIS — M6281 Muscle weakness (generalized): Secondary | ICD-10-CM

## 2016-01-29 DIAGNOSIS — M25571 Pain in right ankle and joints of right foot: Secondary | ICD-10-CM

## 2016-01-29 DIAGNOSIS — R600 Localized edema: Secondary | ICD-10-CM | POA: Diagnosis not present

## 2016-01-29 NOTE — Therapy (Signed)
[facility] [place] [address] [place], [place], [address] Phone: [phone]   Fax:  [phone]  Physical Therapy Treatment  Patient Details  Name: Whitney Flores MRN: [id number] Date of Birth: [birth year] Referring Provider: Dr Denyse Amass  Encounter Date: 01/29/2016      PT End of Session - 01/29/16 0939    Visit Number 3   Number of Visits 8   Date for PT Re-Evaluation 02/19/16   PT Start Time 0937   PT Stop Time 1029   PT Time Calculation (min) 52 min   Activity Tolerance Patient tolerated treatment well      History reviewed. No pertinent past medical history.  History reviewed. No pertinent past surgical history.  There were no vitals filed for this visit.      Subjective Assessment - 01/29/16 0935    Subjective Pt went to a wedding over the weekend and her calf muscles are sore ( bilat) She did not wear heels, wore flats as little as possible and got in her brace as soon as she was able. Getting her bone stimulator today.    Patient Stated Goals no pain, workout and exercise - was doing squats, sliding side to side on machine    Currently in Pain? Yes   Pain Score 2    Pain Location Leg   Pain Orientation Right;Left   Pain Descriptors / Indicators Aching   Pain Type Acute pain   Pain Radiating Towards her calves from the wedding.    Pain Frequency Intermittent   Aggravating Factors  standing for long periods of time   Pain Relieving Factors wearing the boot.             [facility] PT Assessment - 01/29/16 0001    Assessment   Medical Diagnosis Rt ankle fracture - closed   Referring Provider Dr Denyse Amass   Onset Date/Surgical Date 11/20/15   Next MD Visit 02/13/16   AROM   AROM Assessment Site Ankle   Right Ankle Dorsiflexion 10   PROM   Right Ankle Dorsiflexion 15                     [facility] Adult PT Treatment/Exercise - 01/29/16 0001    Exercises   Exercises Ankle;Knee/Hip   Knee/Hip Exercises:  Aerobic   Recumbent Bike L1x5' VC to keep heels down   Knee/Hip Exercises: Standing   Wall Squat 2 sets;10 reps  with marching, weight through heels   Knee/Hip Exercises: Supine   Bridges Strengthening;Both;2 sets;10 reps  with leg extensions   Knee/Hip Exercises: Sidelying   Clams 20 reps Rt LE regular and reverse   Modalities   Modalities Vasopneumatic   Vasopneumatic   Number Minutes Vasopneumatic  15 minutes   Vasopnuematic Location  Ankle   Vasopneumatic Pressure Medium   Vasopneumatic Temperature  3*   Manual Therapy   Manual Therapy Passive ROM   Passive ROM stretch into DF, PF   Ankle Exercises: Stretches   Gastroc Stretch 30 seconds  bilat then with prostretch   Ankle Exercises: Standing   SLS Rt on black therapad at rebounder- to hard, changed to blue pad , straight and 45 degree angle each side.    Ankle Exercises: Seated   BAPS Sitting;Level 3  clocks and circles.                      PT Long Term Goals - 01/29/16 0941    PT LONG  TERM GOAL #1   Title I with advanced HEP to include return to her normal exercise routine ( 02/19/16)    Time 4   Period Weeks   Status On-going   PT LONG TERM GOAL #2   Title increase Rt dorsiflexion =/> 15 degrees, ( 02/19/16)    Time 4   Period Weeks   Status On-going   PT LONG TERM GOAL #3   Title increase strength Rt LE =/> 5-/5 throughout ( 02/19/16)    Time 4   Period Weeks   Status On-going   PT LONG TERM GOAL #4   Title ambulate on stairs with alternating gait pattern. ( 02/19/16)    Time 4   Period Weeks   Status On-going   PT LONG TERM GOAL #5   Title improve FOTO =/< 38% limited ( 02/19/16)    Time 4   Period Weeks   Status On-going               Plan - 01/29/16 1017    Clinical Impression Statement Whitney CoasterGina is making progress to her goals, ROM is improving. She tolerates standing ther ex without pain. She is getting her bone stimulator today.      Rehab Potential Excellent   PT Frequency 2x /  week   PT Duration 4 weeks   PT Treatment/Interventions Ultrasound;Balance training;Neuromuscular re-education;Patient/family education;Passive range of motion;Gait training;Cryotherapy;Stair training;Dry needling;Electrical Stimulation;Moist Heat;Therapeutic exercise;Manual techniques;Vasopneumatic Device;Taping   PT Next Visit Plan Continue progressive strengthening, stretching and proprioceptive activities for RLE.   Consulted and Agree with Plan of Care Patient      Patient will benefit from skilled therapeutic intervention in order to improve the following deficits and impairments:  Increased edema, Decreased strength, Hypomobility, Pain, Abnormal gait, Decreased range of motion  Visit Diagnosis: Pain in right ankle and joints of right foot  Other abnormalities of gait and mobility  Localized edema  Stiffness of right ankle, not elsewhere classified  Muscle weakness (generalized)     Problem List Patient Active Problem List   Diagnosis Date Noted  . Ankle fracture, right 11/21/2015    Roderic ScarceSusan Shaver PT 01/29/2016, 10:20 AM  [facility] [address] [place], [place], [address] Phone: [phone]   Fax:  [phone]  Name: Whitney Flores MRN: [id number] Date of Birth: 04-07-1966

## 2016-01-31 ENCOUNTER — Encounter: Payer: Self-pay | Admitting: Physical Therapy

## 2016-01-31 ENCOUNTER — Ambulatory Visit (INDEPENDENT_AMBULATORY_CARE_PROVIDER_SITE_OTHER): Payer: Worker's Compensation | Admitting: Physical Therapy

## 2016-01-31 DIAGNOSIS — R600 Localized edema: Secondary | ICD-10-CM | POA: Diagnosis not present

## 2016-01-31 DIAGNOSIS — M25571 Pain in right ankle and joints of right foot: Secondary | ICD-10-CM | POA: Diagnosis not present

## 2016-01-31 DIAGNOSIS — R2689 Other abnormalities of gait and mobility: Secondary | ICD-10-CM

## 2016-01-31 DIAGNOSIS — M25671 Stiffness of right ankle, not elsewhere classified: Secondary | ICD-10-CM

## 2016-01-31 DIAGNOSIS — M6281 Muscle weakness (generalized): Secondary | ICD-10-CM

## 2016-01-31 NOTE — Patient Instructions (Signed)
Balance: Three-Way Leg Swing   [place] [phone]    Stand on right foot, hands on hips. Reach other foot forward __1__ times, sideways __1__ times, back __1__ times. Hold each position __1__ seconds. Relax. Repeat _10___ times per set. Do __2-3__ sets per session. Do _1___ sessions per day.   Balance: Unilateral - Forward Lean    Stand on right foot, hands on hips. Keeping hips level, bend forward as if to touch forehead to wall. Hold __1-2__ seconds. Relax. Repeat __10__ times per set. Do _2-3___ sets per session. Do _1___ sessions per day.  Copyright  VHI. All rights reserved.

## 2016-01-31 NOTE — Therapy (Signed)
[facility] [place] [address] [place], [place], [address] Phone: [phone]   Fax:  [phone]  Physical Therapy Treatment  Patient Details  Name: Whitney CoveyGina Sacca MRN: [id number] Date of Birth: Oct 08, 1965 Referring Provider: Dr Denyse Amassorey  Encounter Date: 01/31/2016      PT End of Session - 01/31/16 0935    Visit Number 4   Number of Visits 8   Date for PT Re-Evaluation 02/19/16   PT Start Time 0935   PT Stop Time 1026   PT Time Calculation (min) 51 min   Activity Tolerance Patient tolerated treatment well      History reviewed. No pertinent past medical history.  History reviewed. No pertinent past surgical history.  There were no vitals filed for this visit.      Subjective Assessment - 01/31/16 0935    Subjective Pt recieved her bone stimulator yesterday.    Patient Stated Goals no pain, workout and exercise - was doing squats, sliding side to side on machine    Currently in Pain? No/denies                         [facility] Treatment/Exercise - 01/31/16 0001    Exercises   Exercises Ankle;Knee/Hip   Knee/Hip Exercises: Aerobic   Recumbent Bike L1x5' VC to keep heels down   Modalities   Modalities Vasopneumatic   Vasopneumatic   Number Minutes Vasopneumatic  15 minutes   Vasopnuematic Location  Ankle   Vasopneumatic Pressure Medium   Vasopneumatic Temperature  3*   Ankle Exercises: Standing   SLS Rt on blue pad 5x30sec, HHA PRN  compliant surface, toe taps 3 directions, then FWD leans   Heel Raises 10 reps  each off edge of step, toes in/out/straight   Other Standing Ankle Exercises heel taps Lt with Rt foot on 8" step, working on DF   Ankle Exercises: Seated   Towel Crunch --  Rt foot, 10 reps 1.5#                PT Education - 01/31/16 0956    Education provided Yes   Education Details HEP    Person(s) Educated Patient   Methods Explanation;Demonstration;Handout   Comprehension Verbalized understanding;Returned demonstration             PT Long Term Goals - 01/29/16 0941    PT LONG TERM GOAL #1   Title I with advanced HEP to include return to her normal exercise routine ( 02/19/16)    Time 4   Period Weeks   Status On-going   PT LONG TERM GOAL #2   Title increase Rt dorsiflexion =/> 15 degrees, ( 02/19/16)    Time 4   Period Weeks   Status On-going   PT LONG TERM GOAL #3   Title increase strength Rt LE =/> 5-/5 throughout ( 02/19/16)    Time 4   Period Weeks   Status On-going   PT LONG TERM GOAL #4   Title ambulate on stairs with alternating gait pattern. ( 02/19/16)    Time 4   Period Weeks   Status On-going   PT LONG TERM GOAL #5   Title improve FOTO =/< 38% limited ( 02/19/16)    Time 4   Period Weeks   Status On-going               Plan - 01/31/16 1010    Clinical Impression Statement Todd tolerated standing  proprioception exercise well, did have some fatigue as expected.    Rehab Potential Excellent   PT Frequency 2x / week   PT Duration 4 weeks   PT Treatment/Interventions Ultrasound;Balance training;Neuromuscular re-education;Patient/family education;Passive range of motion;Gait training;Cryotherapy;Stair training;Dry needling;Electrical Stimulation;Moist Heat;Therapeutic exercise;Manual techniques;Vasopneumatic Device;Taping   PT Next Visit Plan Continue progressive strengthening, stretching and proprioceptive activities for RLE.   Consulted and Agree with Plan of Care Patient      Patient will benefit from skilled therapeutic intervention in order to improve the following deficits and impairments:     Visit Diagnosis: Pain in right ankle and joints of right foot  Other abnormalities of gait and mobility  Localized edema  Stiffness of right ankle, not elsewhere classified  Muscle weakness (generalized)     Problem List Patient Active Problem List   Diagnosis Date Noted  . Ankle fracture, right  11/21/2015    SusanShaver PT 01/31/2016, 10:11 AM  [facility] [address] [place], [place], [address] Phone: [phone]   Fax:  [phone]  Name: Raychel Dowler MRN: [id number] Date of Birth: 04/16/1966

## 2016-02-05 ENCOUNTER — Ambulatory Visit (INDEPENDENT_AMBULATORY_CARE_PROVIDER_SITE_OTHER): Payer: Worker's Compensation | Admitting: Physical Therapy

## 2016-02-05 DIAGNOSIS — R2689 Other abnormalities of gait and mobility: Secondary | ICD-10-CM | POA: Diagnosis not present

## 2016-02-05 DIAGNOSIS — M25571 Pain in right ankle and joints of right foot: Secondary | ICD-10-CM | POA: Diagnosis not present

## 2016-02-05 NOTE — Patient Instructions (Signed)
Balance: Three-Way Leg Swing    Stand on left foot, hands on hips. Reach other foot forward, sideways, back. Repeat 10 times. Hold each position __1-2__ seconds. Relax. (can bend Rt knee - mini squat, with each heel tap)    [facility]  [address] [place], [place] [address]  [phone] ([phone] (fax)

## 2016-02-05 NOTE — Therapy (Signed)
[place], [place], [address] Phone: [phone]   Fax:  [phone]  Physical Therapy Treatment  Patient Details  Name: Whitney Flores MRN: [id number] Date of Birth: December 22, 1965 Referring Provider: Dr. Georgina Snell   Encounter Date: 02/05/2016      PT End of Session - 02/05/16 0933    Visit Number 5   Number of Visits 8   Date for PT Re-Evaluation 02/19/16   PT Start Time 0934   PT Stop Time 1028   PT Time Calculation (min) 54 min   Activity Tolerance Patient tolerated treatment well;No increased pain      No past medical history on file.  No past surgical history on file.  There were no vitals filed for this visit.      Subjective Assessment - 02/05/16 0935    Subjective Pt reports no new changes since last visit.    Patient Stated Goals no pain, workout and exercise - was doing squats, sliding side to side on machine    Currently in Pain? No/denies            [facility] PT Assessment - 02/05/16 0001    Assessment   Medical Diagnosis Rt ankle fracture - closed   Referring Provider Dr. Georgina Snell    Onset Date/Surgical Date 11/20/15   Hand Dominance Right   Next MD Visit 02/13/16   Strength   Right Hip Flexion 4+/5   Right Hip Extension --  5-/5   Right Hip ABduction 5/5   Right Knee Flexion 5/5   Right Knee Extension 5/5   Right Ankle Dorsiflexion 4+/5  with ache   Right Ankle Inversion 5/5   Right Ankle Eversion 4+/5  with ache         [facility] Adult PT Treatment/Exercise - 02/05/16 0001    Knee/Hip Exercises: Aerobic   Recumbent Bike L2: 5 min    Knee/Hip Exercises: Standing   Step Down Right;Left;2 sets;10 reps;Hand Hold: 2  3", 6" step   Step Down Limitations Mirror for visual feedback to avoid hip compensations.    Knee/Hip Exercises: Supine   Straight Leg Raises Strengthening;Right;1 set;10 reps   Straight Leg Raise with External Rotation Right;1 set;10 reps  on elbows, with hip  abd/add   Vasopneumatic   Number Minutes Vasopneumatic  15 minutes   Vasopnuematic Location  Ankle   Vasopneumatic Pressure Medium   Vasopneumatic Temperature  3 snowflakes   Ankle Exercises: Stretches   Soleus Stretch 3 reps;30 seconds   Gastroc Stretch 3 reps;30 seconds   Ankle Exercises: Standing   Heel Raises 10 reps  each off edge of step, toes in/out/straight   Ankle Exercises: Seated   Other Seated Ankle Exercises Green band exercise: DF/ inversion/eversion (RLE) x 10 reps each                 PT Education - 02/05/16 1014    Education provided Yes   Education Details Pt issued green band for ankle exercises.   Added toe taps with SLS to HEP.    Person(s) Educated Patient   Methods Explanation;Handout;Demonstration;Verbal cues   Comprehension Returned demonstration;Verbalized understanding             PT Long Term Goals - 02/05/16 0956    PT LONG TERM GOAL #1   Title I with advanced HEP to include return to her normal exercise routine ( 02/19/16)    Time 4   Period Weeks   Status On-going  PT LONG TERM GOAL #2   Title increase Rt dorsiflexion =/> 15 degrees, ( 02/19/16)    Time 4   Period Weeks   Status Achieved   PT LONG TERM GOAL #3   Title increase strength Rt LE =/> 5-/5 throughout ( 02/19/16)    Time 4   Period Weeks   Status Partially Met   PT LONG TERM GOAL #4   Title ambulate on stairs with alternating gait pattern. ( 02/19/16)    Time 4   Period Weeks   Status Achieved   PT LONG TERM GOAL #5   Title improve FOTO =/< 38% limited ( 02/19/16)    Time 4   Period Weeks   Status On-going               Plan - 02/05/16 0951    Clinical Impression Statement Pt demonstrated improved RLE strength; continues with weakness in Rt hip flexion, ankle eversion/DF (PF not tested). Pt demonstrated ability to ascend/descend stairs with reciprocal pattern; met LTG #4.  Pt has partially met LTG # 3, met LTG #2 last visit, and is progressing well  towards remaining goals.    Rehab Potential Excellent   PT Frequency 2x / week   PT Duration 4 weeks   PT Treatment/Interventions Ultrasound;Balance training;Neuromuscular re-education;Patient/family education;Passive range of motion;Gait training;Cryotherapy;Stair training;Dry needling;Electrical Stimulation;Moist Heat;Therapeutic exercise;Manual techniques;Vasopneumatic Device;Taping   PT Next Visit Plan Continue progressive strengthening, stretching and proprioceptive activities for RLE.   Consulted and Agree with Plan of Care Patient      Patient will benefit from skilled therapeutic intervention in order to improve the following deficits and impairments:  Increased edema, Decreased strength, Hypomobility, Pain, Abnormal gait, Decreased range of motion  Visit Diagnosis: Pain in right ankle and joints of right foot  Other abnormalities of gait and mobility     Problem List Patient Active Problem List   Diagnosis Date Noted  . Ankle fracture, right 11/21/2015   [name], PTA 02/05/2016 10:17 AM  [facility] [place], [place], [address] Phone: [phone]   Fax:  [phone]  Name: Rochelle Larue MRN: [id number] Date of Birth: 03-28-1966

## 2016-02-07 ENCOUNTER — Encounter: Payer: Self-pay | Admitting: Physical Therapy

## 2016-02-09 ENCOUNTER — Ambulatory Visit (INDEPENDENT_AMBULATORY_CARE_PROVIDER_SITE_OTHER): Payer: Worker's Compensation | Admitting: Physical Therapy

## 2016-02-09 DIAGNOSIS — R2689 Other abnormalities of gait and mobility: Secondary | ICD-10-CM | POA: Diagnosis not present

## 2016-02-09 DIAGNOSIS — M25671 Stiffness of right ankle, not elsewhere classified: Secondary | ICD-10-CM | POA: Diagnosis not present

## 2016-02-09 DIAGNOSIS — M25571 Pain in right ankle and joints of right foot: Secondary | ICD-10-CM | POA: Diagnosis not present

## 2016-02-09 DIAGNOSIS — M6281 Muscle weakness (generalized): Secondary | ICD-10-CM

## 2016-02-09 DIAGNOSIS — R600 Localized edema: Secondary | ICD-10-CM

## 2016-02-09 NOTE — Therapy (Signed)
[place], [place], [address] Phone: [phone]   Fax:  [phone]  Physical Therapy Treatment  Patient Details  Name: Whitney Flores MRN: [id number] Date of Birth: 02-13-1966 Referring Provider: Dr. Georgina Snell  Encounter Date: 02/09/2016      PT End of Session - 02/09/16 1026    Visit Number 6   Number of Visits 8   Date for PT Re-Evaluation 02/19/16   PT Start Time 1017   PT Stop Time 1115   PT Time Calculation (min) 58 min   Activity Tolerance --  mild increase in pain with SLS      No past medical history on file.  No past surgical history on file.  There were no vitals filed for this visit.      Subjective Assessment - 02/09/16 1026    Subjective Pt reports her Rt ankle feels achy, but feels it is due to the weather.  She is hopefully she will be out of boot after Tuesday next week.    Currently in Pain? Yes   Pain Score 4    Pain Location Ankle   Pain Orientation Right   Pain Descriptors / Indicators Aching;Dull   Aggravating Factors  weather, standing too long   Pain Relieving Factors stretches, walking short distances.             [facility] PT Assessment - 02/09/16 0001    Assessment   Medical Diagnosis Rt ankle fracture - closed   Referring Provider Dr. Georgina Snell   Onset Date/Surgical Date 11/20/15   Hand Dominance Right   Next MD Visit 02/13/16   AROM   AROM Assessment Site Ankle   Right Ankle Dorsiflexion 10   Strength   Right Hip Flexion 5/5   Right Hip Extension 5/5   Right Hip ABduction 5/5   Right Ankle Dorsiflexion --  5-/5 without pain   Right Ankle Eversion --  5-/5, no pain            [facility] Adult PT Treatment/Exercise - 02/09/16 0001    Knee/Hip Exercises: Stretches   Passive Hamstring Stretch Right;3 reps;30 seconds   Quad Stretch Right;2 reps;30 seconds   Knee/Hip Exercises: Aerobic   Recumbent Bike L3: 5 min    Knee/Hip Exercises: Standing   Functional Squat 2 sets;10 reps  slow and controlled, with mirror for feedback   Modalities   Modalities Vasopneumatic;Electrical Sales promotion account executive Location Rt ankle    Electrical Stimulation Action IFC   Electrical Stimulation Parameters to tolerance    Electrical Stimulation Goals Pain   Vasopneumatic   Number Minutes Vasopneumatic  15 minutes   Vasopnuematic Location  Ankle   Vasopneumatic Pressure Medium   Vasopneumatic Temperature  3 snowflakes   Ankle Exercises: Stretches   Soleus Stretch 3 reps;30 seconds   Gastroc Stretch 3 reps;30 seconds   Ankle Exercises: Standing   SLS Rt SLS with mini squat while heel taps to front, side, and back (challenging- tactile cues for improved form)   Heel Raises 10 reps  each off edge of step, toes in/straight - slowly          PT Long Term Goals - 02/05/16 0956    PT LONG TERM GOAL #1   Title I with advanced HEP to include return to her normal exercise routine ( 02/19/16)    Time 4   Period Weeks   Status On-going   PT LONG TERM GOAL #  2   Title increase Rt dorsiflexion =/> 15 degrees, ( 02/19/16)    Time 4   Period Weeks   Status Achieved   PT LONG TERM GOAL #3   Title increase strength Rt LE =/> 5-/5 throughout ( 02/19/16)    Time 4   Period Weeks   Status achieved   PT LONG TERM GOAL #4   Title ambulate on stairs with alternating gait pattern. ( 02/19/16)    Time 4   Period Weeks   Status Achieved   PT LONG TERM GOAL #5   Title improve FOTO =/< 38% limited ( 02/19/16)    Time 4   Period Weeks   Status On-going               Plan - 02/09/16 1301    Clinical Impression Statement Pt presented with increased ankle pain today (4/10) that increased up to 7/10 with SLS activities.  Pt tolerated therapeutic exercise; pain reduced with rest.   Pt reported elimination of pain with use of vaso and estim at end of session.  Pt demonstrated improved hip and ankle strength this session;  has met LTG #3   Rehab Potential Excellent   PT Frequency 2x / week   PT Duration 4 weeks   PT Treatment/Interventions Ultrasound;Balance training;Neuromuscular re-education;Patient/family education;Passive range of motion;Gait training;Cryotherapy;Stair training;Dry needling;Electrical Stimulation;Moist Heat;Therapeutic exercise;Manual techniques;Vasopneumatic Device;Taping   PT Next Visit Plan Continue progressive strengthening, stretching and proprioceptive activities for RLE.  MD note, FOTO.     Consulted and Agree with Plan of Care Patient      Patient will benefit from skilled therapeutic intervention in order to improve the following deficits and impairments:  Increased edema, Decreased strength, Hypomobility, Pain, Abnormal gait, Decreased range of motion  Visit Diagnosis: Pain in right ankle and joints of right foot  Other abnormalities of gait and mobility  Localized edema  Stiffness of right ankle, not elsewhere classified  Muscle weakness (generalized)     Problem List Patient Active Problem List   Diagnosis Date Noted  . Ankle fracture, right 11/21/2015   [name], PTA 02/09/2016 1:08 PM  [facility] [place], [place], [address] Phone: [phone]   Fax:  [phone]  Name: Whitney Flores MRN: [id number] Date of Birth: [birth year]

## 2016-02-12 ENCOUNTER — Ambulatory Visit (INDEPENDENT_AMBULATORY_CARE_PROVIDER_SITE_OTHER): Payer: Worker's Compensation | Admitting: Physical Therapy

## 2016-02-12 DIAGNOSIS — M6281 Muscle weakness (generalized): Secondary | ICD-10-CM

## 2016-02-12 DIAGNOSIS — R600 Localized edema: Secondary | ICD-10-CM | POA: Diagnosis not present

## 2016-02-12 DIAGNOSIS — R2689 Other abnormalities of gait and mobility: Secondary | ICD-10-CM

## 2016-02-12 DIAGNOSIS — M25571 Pain in right ankle and joints of right foot: Secondary | ICD-10-CM

## 2016-02-12 DIAGNOSIS — M25671 Stiffness of right ankle, not elsewhere classified: Secondary | ICD-10-CM

## 2016-02-12 NOTE — Therapy (Signed)
[place], [place], [address] Phone: [phone]   Fax:  [phone]  Physical Therapy Treatment  Patient Details  Name: Whitney Flores MRN: [id number] Date of Birth: Nov 18, 1965 Referring Provider: Dr. Georgina Snell   Encounter Date: 02/12/2016      PT End of Session - 02/12/16 0936    Visit Number 7   Number of Visits 8   Date for PT Re-Evaluation 02/19/16   PT Start Time 0934   PT Stop Time 1024   PT Time Calculation (min) 50 min   Activity Tolerance --  mild increase in pain with SLS RLE, reduced with rest      No past medical history on file.  No past surgical history on file.  There were no vitals filed for this visit.      Subjective Assessment - 02/12/16 0936    Subjective Pt reports she has had achiness (intermittent) in Rt anterior ankle since beginning the bone stimulator and starting more difficult exercises.  She is hopeful that she will be out boot after next MD visit tomorrow.     Currently in Pain? Yes   Pain Score 5    Pain Location Ankle   Pain Orientation Right   Pain Descriptors / Indicators Aching   Pain Frequency Intermittent   Aggravating Factors  weather, standing too long    Pain Relieving Factors stretches, walking short distances.             [facility] PT Assessment - 02/12/16 0001    Assessment   Medical Diagnosis Rt ankle fracture - closed   Referring Provider Dr. Georgina Snell    Onset Date/Surgical Date 11/20/15   Hand Dominance Right   Next MD Visit 02/13/16   AROM   AROM Assessment Site Ankle   Right/Left Ankle Right   Right Ankle Dorsiflexion 11   Right Ankle Plantar Flexion 48   Right Ankle Inversion 35   Right Ankle Eversion 22   PROM   Right/Left Ankle Right   Right Ankle Dorsiflexion 15   Strength   Right Hip Flexion 5/5   Right Hip Extension 5/5   Right Hip ABduction 5/5   Right Ankle Dorsiflexion --  5-/5 without pain   Right Ankle Plantar Flexion  --  3/5 , unable to perform heel raise (RLE only)   Right Ankle Inversion 5/5   Right Ankle Eversion --  5-/5, no pain           [facility] Adult PT Treatment/Exercise - 02/12/16 0001    Knee/Hip Exercises: Aerobic   Recumbent Bike L2: 5 min    Knee/Hip Exercises: Standing   Heel Raises Limitations Pt unable to complete Rt single leg heel raise (able to on LLE)    Other Standing Knee Exercises Standing tandem walking with VC for heel-toe x 15 ft x 4 reps    Modalities   Modalities Electrical Stimulation;Vasopneumatic   Electrical Stimulation   Electrical Stimulation Location Rt ankle    Electrical Stimulation Action IFC   Electrical Stimulation Parameters to tolerance   Electrical Stimulation Goals Pain   Vasopneumatic   Number Minutes Vasopneumatic  15 minutes   Vasopnuematic Location  Ankle   Vasopneumatic Pressure Medium   Vasopneumatic Temperature  3 snowflakes   Ankle Exercises: Stretches   Soleus Stretch 3 reps;30 seconds   Gastroc Stretch 3 reps;30 seconds   Ankle Exercises: Standing   BAPS Standing;Level 3;10 reps;Level 2  Level3 too difficult, moved to 2  SLS SLS on mini tramp with horizontal and vertical head turns x 30 sec x 2 reps    Heel Raises 10 reps  2 ft up, RLE lower   Heel Walk (Round Trip) 8 ft   Toe Walk (Round Trip) 8 ft          PT Long Term Goals - 02/05/16 3748    PT LONG TERM GOAL #1   Title I with advanced HEP to include return to her normal exercise routine ( 02/19/16)    Time 4   Period Weeks   Status On-going   PT LONG TERM GOAL #2   Title increase Rt dorsiflexion =/> 15 degrees, ( 02/19/16)    Time 4   Period Weeks   Status Achieved   PT LONG TERM GOAL #3   Title increase strength Rt LE =/> 5-/5 throughout ( 02/19/16)    Time 4   Period Weeks   Status Partially Met   PT LONG TERM GOAL #4   Title ambulate on stairs with alternating gait pattern. ( 02/19/16)    Time 4   Period Weeks   Status Achieved   PT LONG TERM GOAL #5   Title  improve FOTO =/< 38% limited ( 02/19/16)    Time 4   Period Weeks   Status On-going               Plan - 02/12/16 1035    Clinical Impression Statement Pt continues with intermittent Rt ankle pain that worsens with SLS activities.  Pt's Rt ankle ROM has improved, as has strength.  Pt continues with functional weakness in RLE with standing activities. Pt has made good progress towards established goals and will benefit from continued PT intervention to maximize functional mobility independence and safety.    Rehab Potential Excellent   PT Frequency 2x / week   PT Duration 4 weeks   PT Treatment/Interventions Ultrasound;Balance training;Neuromuscular re-education;Patient/family education;Passive range of motion;Gait training;Cryotherapy;Stair training;Dry needling;Electrical Stimulation;Moist Heat;Therapeutic exercise;Manual techniques;Vasopneumatic Device;Taping   PT Next Visit Plan Continue progressive strengthening, stretching and proprioceptive activities for RLE.  FOTO.  Assess for renewal.      Consulted and Agree with Plan of Care Patient      Patient will benefit from skilled therapeutic intervention in order to improve the following deficits and impairments:  Increased edema, Decreased strength, Hypomobility, Pain, Abnormal gait, Decreased range of motion  Visit Diagnosis: Pain in right ankle and joints of right foot  Other abnormalities of gait and mobility  Localized edema  Stiffness of right ankle, not elsewhere classified  Muscle weakness (generalized)     Problem List Patient Active Problem List   Diagnosis Date Noted  . Ankle fracture, right 11/21/2015   [name], PTA 02/12/2016 11:18 AM  [place], [place], [address] Phone: [phone]   Fax:  [phone]  Name: Whitney Flores MRN: [id number] Date of Birth: 02-18-1966

## 2016-02-13 ENCOUNTER — Encounter: Payer: Self-pay | Admitting: Family Medicine

## 2016-02-13 ENCOUNTER — Ambulatory Visit (INDEPENDENT_AMBULATORY_CARE_PROVIDER_SITE_OTHER): Payer: Worker's Compensation

## 2016-02-13 ENCOUNTER — Ambulatory Visit (INDEPENDENT_AMBULATORY_CARE_PROVIDER_SITE_OTHER): Payer: Worker's Compensation | Admitting: Family Medicine

## 2016-02-13 VITALS — BP 128/82 | HR 77 | Wt 199.0 lb

## 2016-02-13 DIAGNOSIS — S82491G Other fracture of shaft of right fibula, subsequent encounter for closed fracture with delayed healing: Secondary | ICD-10-CM

## 2016-02-13 DIAGNOSIS — S82891A Other fracture of right lower leg, initial encounter for closed fracture: Secondary | ICD-10-CM | POA: Diagnosis not present

## 2016-02-13 DIAGNOSIS — X58XXXD Exposure to other specified factors, subsequent encounter: Secondary | ICD-10-CM | POA: Diagnosis not present

## 2016-02-13 NOTE — Patient Instructions (Signed)
Thank you for coming in today. Continue the bone stimulator.  Use the lace up ankle brace for most activities.  Use the cam walker boot if you get sore or for vigorous activities.  Return in 2 weeks.  Continue current work restrictions.

## 2016-02-13 NOTE — Progress Notes (Signed)
Quick Note:  Some healing seen on xray ______

## 2016-02-13 NOTE — Progress Notes (Signed)
       Whitney CoveyGina Beer is a 50 y.o. female [facility] today for follow-up right ankle fracture. Patient was originally seen on February 20 for fracture of the right ankle. She's been doing well clinically however has had failure to calcify her fracture. She was given a bone stimulator about 2 weeks ago. She feels well.  She has ambulated some in an ASO ankle brace and denies significant pain.    No past medical history on file. No past surgical history on file. Social History  Substance Use Topics  . Smoking status: Unknown If Ever Smoked  . Smokeless tobacco: Not on file  . Alcohol Use: Not on file   family history is not on file.  ROS as above Medications: Current Outpatient Prescriptions  Medication Sig Dispense Refill  . ibuprofen (ADVIL,MOTRIN) 800 MG tablet Take 1 tablet (800 mg total) by mouth every 8 (eight) hours as needed. 60 tablet 2   No current facility-administered medications for this visit.   No Known Allergies   Exam:  BP 128/82 mmHg  Pulse 77  Wt 199 lb (90.266 kg)  LMP 02/10/2016 Gen: Well NAD Right Ankle is well appearing and nontender with normal motion.    No results found for this or any previous visit (from the past 24 hour(s)). Dg Ankle Complete Right  02/13/2016  CLINICAL DATA:  Patient here for f/u right ankle fracture, states that she is doing better but has still has some pains with weight bearing EXAM: RIGHT ANKLE - COMPLETE 3+ VIEW COMPARISON:  01/19/2016 FINDINGS: The oblique fracture of the distal fibula demonstrates mild resorption along the fracture line, but no bony bridging or callus formation. There is no fracture displacement or angulation. The ankle mortise is normally spaced and aligned. There are no other fractures. Moderate plantar calcaneal spur. IMPRESSION: 1. Mild resorption along the fracture line of the distal fibula,  but no other evidence of healing. No callus formation or bony bridging. 2. No fracture displacement or angulation. Normally aligned ankle joint. Electronically Signed   By: Amie Portlandavid  Ormond M.D.   On: 02/13/2016 [id number]     50 year old woman with right ankle fracture with fibrous union. Likely doing quite well. Advance to ASO brace. Continue physical therapy. Continue bone stimulator. Recheck in 2 weeks.

## 2016-02-14 ENCOUNTER — Encounter: Payer: Self-pay | Admitting: Physical Therapy

## 2016-02-16 ENCOUNTER — Telehealth: Payer: Self-pay

## 2016-02-16 NOTE — Telephone Encounter (Signed)
Pt called and would like to know if you fell it is ok to drive. Advised pt that I would send and note and to hold off on driving through the weekend and I would call her with an answer on Monday. Please advise.

## 2016-02-17 NOTE — Telephone Encounter (Signed)
It is ok to drive without the boot. Practice driving in a safe area first.

## 2016-02-19 ENCOUNTER — Encounter: Payer: Worker's Compensation | Admitting: Physical Therapy

## 2016-02-19 NOTE — Telephone Encounter (Signed)
Pt notified of recommendation 

## 2016-02-21 ENCOUNTER — Ambulatory Visit (INDEPENDENT_AMBULATORY_CARE_PROVIDER_SITE_OTHER): Payer: Worker's Compensation | Admitting: Physical Therapy

## 2016-02-21 DIAGNOSIS — R600 Localized edema: Secondary | ICD-10-CM | POA: Diagnosis not present

## 2016-02-21 DIAGNOSIS — R2689 Other abnormalities of gait and mobility: Secondary | ICD-10-CM | POA: Diagnosis not present

## 2016-02-21 DIAGNOSIS — M25671 Stiffness of right ankle, not elsewhere classified: Secondary | ICD-10-CM

## 2016-02-21 DIAGNOSIS — M25571 Pain in right ankle and joints of right foot: Secondary | ICD-10-CM | POA: Diagnosis not present

## 2016-02-21 NOTE — Therapy (Signed)
[place], [place], [address] Phone: [phone]   Fax:  [phone]  Physical Therapy Treatment  Patient Details  Name: Whitney Flores MRN: [id number] Date of Birth: July 15, 1966 Referring Provider: Dr. Georgina Snell  Encounter Date: 02/21/2016      PT End of Session - 02/21/16 0932    Visit Number 8   Number of Visits 8   Date for PT Re-Evaluation 02/19/16   PT Start Time 0928   PT Stop Time 1026   PT Time Calculation (min) 58 min   Activity Tolerance Patient tolerated treatment well;No increased pain      No past medical history on file.  No past surgical history on file.  There were no vitals filed for this visit.      Subjective Assessment - 02/21/16 0945    Subjective Pt reports she began driving 2 days ago.  Continues to use bone stimulator. She has intermittent pain (achy) in Rt ankle when she is without brace but no shoes at home.    Currently in Pain? No/denies  up to 3/10.             [facility] PT Assessment - 02/21/16 0001    Assessment   Medical Diagnosis Rt ankle fracture - closed   Referring Provider Dr. Georgina Snell   Onset Date/Surgical Date 11/20/15   Hand Dominance Right   Next MD Visit 02/27/16   Observation/Other Assessments   Focus on Therapeutic Outcomes (FOTO)  45% limited (63% at intake and 38% goal)    PROM   Right/Left Ankle Right   Right Ankle Dorsiflexion 11   Strength   Right Hip Flexion 5/5   Right Ankle Dorsiflexion 5/5   Right Ankle Plantar Flexion --  13 heel raises, then pain   Right Ankle Inversion 5/5   Right Ankle Eversion 5/5          [facility] Adult PT Treatment/Exercise - 02/21/16 0001    Knee/Hip Exercises: Stretches   Passive Hamstring Stretch Right;3 reps;30 seconds   Quad Stretch Right;2 reps;30 seconds   Gastroc Stretch 3 reps;Left;Right;20 seconds   Soleus Stretch Right;Left;2 reps;20 seconds   Knee/Hip Exercises: Aerobic   Recumbent Bike L2: 5  min    Knee/Hip Exercises: Standing   Lateral Step Up Right;1 set;Hand Hold: 0  13" step   Forward Step Up Right;1 set;15 reps  13" step    Step Down Left;1 set;10 reps;Hand Hold: 2  3" step, heel taps   Vasopneumatic   Number Minutes Vasopneumatic  15 minutes   Vasopnuematic Location  Ankle   Vasopneumatic Pressure Medium   Vasopneumatic Temperature  3 snowflakes   Ankle Exercises: Standing   BAPS Standing;Level 2;10 reps  PF/DF, side/side, and circles    SLS SLS on bosu x 30 sec x 2 reps    Heel Raises 10 reps  2 ft up, RLE lower   Heel Walk (Round Trip) 12 ft    Toe Walk (Round Trip) 12 ft           PT Long Term Goals - 02/05/16 1368    PT LONG TERM GOAL #1   Title I with advanced HEP to include return to her normal exercise routine ( 02/19/16)    Time 4   Period Weeks   Status On-going   PT LONG TERM GOAL #2   Title increase Rt dorsiflexion =/> 15 degrees, ( 02/19/16)    Time 4   Period Weeks  Status Achieved   PT LONG TERM GOAL #3   Title increase strength Rt LE =/> 5-/5 throughout ( 02/19/16)    Time 4   Period Weeks   Status Partially Met   PT LONG TERM GOAL #4   Title ambulate on stairs with alternating gait pattern. ( 02/19/16)    Time 4   Period Weeks   Status Achieved   PT LONG TERM GOAL #5   Title improve FOTO =/< 38% limited ( 02/19/16)    Time 4   Period Weeks   Status On-going               Plan - 02/21/16 1210    Clinical Impression Statement Pt demonstrated improved strength in Rt ankle, able to complete 13 heel raises (4/5 strength).  Pt had slight decrease in Rt AROM  DF since last assesment; pt reported she had decreased performance of stretch over last week. Whitney Flores tolerated all exercises well, with minimal increase in pain, just fatigue in RLE.  Pt continues with functional weakness in RLE with standing activities.  Pt has made good progress towards established goals. and will benefit from continued PT intervention to maximize  independence and safety.     Rehab Potential Excellent   PT Frequency 2x / week   PT Duration 4 weeks   PT Treatment/Interventions Ultrasound;Balance training;Neuromuscular re-education;Patient/family education;Passive range of motion;Gait training;Cryotherapy;Stair training;Dry needling;Electrical Stimulation;Moist Heat;Therapeutic exercise;Manual techniques;Vasopneumatic Device;Taping   PT Next Visit Plan Continue progressive strengthening, stretching and proprioceptive activities for RLE.     Consulted and Agree with Plan of Care Patient      Patient will benefit from skilled therapeutic intervention in order to improve the following deficits and impairments:  Increased edema, Decreased strength, Hypomobility, Pain, Abnormal gait, Decreased range of motion  Visit Diagnosis: Pain in right ankle and joints of right foot  Other abnormalities of gait and mobility  Localized edema  Stiffness of right ankle, not elsewhere classified     Problem List Patient Active Problem List   Diagnosis Date Noted  . Ankle fracture, right 11/21/2015   [name], PTA 02/21/2016 12:16 PM  Celyn P. Helene Kelp PT, MPH 02/21/2016 12:24 PM   [facility] [place], [place], [address] Phone: [phone]   Fax:  [phone]  Name: Whitney Flores MRN: [id number] Date of Birth: Apr 02, 1966

## 2016-02-23 ENCOUNTER — Ambulatory Visit (INDEPENDENT_AMBULATORY_CARE_PROVIDER_SITE_OTHER): Payer: Worker's Compensation | Admitting: Physical Therapy

## 2016-02-23 DIAGNOSIS — M25671 Stiffness of right ankle, not elsewhere classified: Secondary | ICD-10-CM | POA: Diagnosis not present

## 2016-02-23 DIAGNOSIS — R2689 Other abnormalities of gait and mobility: Secondary | ICD-10-CM

## 2016-02-23 DIAGNOSIS — M25571 Pain in right ankle and joints of right foot: Secondary | ICD-10-CM | POA: Diagnosis not present

## 2016-02-23 NOTE — Therapy (Signed)
[facility] [address] [place], [place], [address] Phone: [phone]   Fax:  [phone]  Physical Therapy Treatment  Patient Details  Name: Whitney CoveyGina Flores MRN: [id number] Date of Birth: [birth year] Referring Provider: Dr. Denyse Amassorey  Encounter Date: 02/23/2016      PT End of Session - 02/23/16 1412    Visit Number 9   Number of Visits 16   Date for PT Re-Evaluation 04/03/16   PT Start Time 1400   PT Stop Time 1459   PT Time Calculation (min) 59 min   Activity Tolerance Patient tolerated treatment well      No past medical history on file.  No past surgical history on file.  There were no vitals filed for this visit.      Subjective Assessment - 02/23/16 1407    Subjective Pt reports no new changes in last two days.  MD gave approval to begin trial of light agility work in therapy.    Patient Stated Goals no pain, workout and exercise - was doing squats, sliding side to side on machine    Currently in Pain? No/denies            [facility] PT Assessment - 02/23/16 0001    Assessment   Medical Diagnosis Rt ankle fracture - closed   Referring Provider Dr. Denyse Amassorey   Onset Date/Surgical Date 11/20/15   Hand Dominance Right   Next MD Visit 02/27/16          [facility] - 02/23/16 0001    Knee/Hip Exercises: Stretches   Quad Stretch Right;2 reps;30 seconds;Left   Gastroc Stretch Left;Right;20 seconds;4 reps   Soleus Stretch Right;Left;20 seconds;4 reps   Knee/Hip Exercises: Aerobic   Elliptical L1: 1 min - stopped due to Lt knee pain.    Recumbent Bike L3: 5 min    Knee/Hip Exercises: Standing   SLS on mini tramp with horizontal head turns x 30 sec x 2 trials.  Gentle 2 foot hops x 12;  SLS with forward leans to chair x 8 reps each side.    Other Standing Knee Exercises 2 foot gentle small hops with controlled landing x 20 ft x 2; side shuffle 25 ft x 5 reps; braiding x 25 feet x 4 reps;    Other Standing Knee Exercises Fitter with black/blue band x 30 reps.   light jog forward/ backward x 25 ft x 2 reps each (slight "pulling" in Rt lateral ankle with retro jog)   Insurance claims handlerlectrical Stimulation   Electrical Stimulation Location Rt ankle    Electrical Stimulation Action IFC   Electrical Stimulation Parameters to tolerance    Electrical Stimulation Goals Pain   Vasopneumatic   Number Minutes Vasopneumatic  15 minutes   Vasopnuematic Location  Ankle   Vasopneumatic Pressure Medium   Vasopneumatic Temperature  3 snowflakes   Ankle Exercises: Stretches   Soleus Stretch 3 reps;30 seconds   Gastroc Stretch 3 reps;30 seconds   Ankle Exercises: Standing   Heel Raises 20 reps  toes in, out, straight.                      PT Long Term Goals - 02/21/16 1219    PT LONG TERM GOAL #1   Title I with advanced HEP to include return to her normal exercise routine ( 04/03/16)    Time 10   Period Weeks   Status Revised   PT LONG TERM GOAL #2  Title increase Rt dorsiflexion =/> 15 degrees, ( 04/03/16)    Time 10   Period Weeks   Status Revised   PT LONG TERM GOAL #3   Title increase strength Rt LE =/> 5-/5 throughout ( 04/03/16)    Time 10   Period Weeks   Status Revised   PT LONG TERM GOAL #4   Title improve strength for functional activites in Rt LE to equal Lt 04/03/16   Time 10   Period Weeks   Status New   PT LONG TERM GOAL #5   Title improve FOTO =/< 38% limited ( 04/03/16)    Time 10   Period Weeks   Status Revised               Plan - 02/23/16 1425    Clinical Impression Statement Pt tolerated all agility exercises with mild increase in pain (3/10), some achiness.  Pain reduced with use of estim and vaso at end of session.  Making great progress towards remaining goals.    Rehab Potential Excellent   PT Frequency 2x / week   PT Duration 4 weeks   PT Treatment/Interventions Ultrasound;Balance training;Neuromuscular re-education;Patient/family education;Passive  range of motion;Gait training;Cryotherapy;Stair training;Dry needling;Electrical Stimulation;Moist Heat;Therapeutic exercise;Manual techniques;Vasopneumatic Device;Taping   PT Next Visit Plan Continue progressive strengthening, stretching and proprioceptive activities for RLE.     Consulted and Agree with Plan of Care Patient      Patient will benefit from skilled therapeutic intervention in order to improve the following deficits and impairments:  Increased edema, Decreased strength, Hypomobility, Pain, Abnormal gait, Decreased range of motion  Visit Diagnosis: Pain in right ankle and joints of right foot  Other abnormalities of gait and mobility  Stiffness of right ankle, not elsewhere classified     Problem List Patient Active Problem List   Diagnosis Date Noted  . Ankle fracture, right 11/21/2015   Mayer Camel, PTA 02/23/2016 2:50 PM  [facility] [place] [address] [place], [place], [address] Phone: [phone]   Fax:  [phone]  Name: Whitney Flores MRN: [id number] Date of Birth: 1965/11/27

## 2016-02-27 ENCOUNTER — Ambulatory Visit (INDEPENDENT_AMBULATORY_CARE_PROVIDER_SITE_OTHER): Payer: Worker's Compensation

## 2016-02-27 ENCOUNTER — Ambulatory Visit (INDEPENDENT_AMBULATORY_CARE_PROVIDER_SITE_OTHER): Payer: Worker's Compensation | Admitting: Family Medicine

## 2016-02-27 ENCOUNTER — Encounter: Payer: Self-pay | Admitting: Family Medicine

## 2016-02-27 ENCOUNTER — Encounter: Payer: Self-pay | Admitting: Physical Therapy

## 2016-02-27 ENCOUNTER — Ambulatory Visit (INDEPENDENT_AMBULATORY_CARE_PROVIDER_SITE_OTHER): Payer: Worker's Compensation | Admitting: Physical Therapy

## 2016-02-27 VITALS — BP 130/81 | HR 80 | Wt 192.0 lb

## 2016-02-27 DIAGNOSIS — M25571 Pain in right ankle and joints of right foot: Secondary | ICD-10-CM

## 2016-02-27 DIAGNOSIS — M25671 Stiffness of right ankle, not elsewhere classified: Secondary | ICD-10-CM | POA: Diagnosis not present

## 2016-02-27 DIAGNOSIS — R600 Localized edema: Secondary | ICD-10-CM | POA: Diagnosis not present

## 2016-02-27 DIAGNOSIS — S82431D Displaced oblique fracture of shaft of right fibula, subsequent encounter for closed fracture with routine healing: Secondary | ICD-10-CM

## 2016-02-27 DIAGNOSIS — M6281 Muscle weakness (generalized): Secondary | ICD-10-CM

## 2016-02-27 DIAGNOSIS — R2689 Other abnormalities of gait and mobility: Secondary | ICD-10-CM | POA: Diagnosis not present

## 2016-02-27 DIAGNOSIS — X58XXXD Exposure to other specified factors, subsequent encounter: Secondary | ICD-10-CM | POA: Diagnosis not present

## 2016-02-27 DIAGNOSIS — S82891A Other fracture of right lower leg, initial encounter for closed fracture: Secondary | ICD-10-CM

## 2016-02-27 NOTE — Progress Notes (Signed)
       Kristian CoveyGina Cozine is a 50 y.o. female who [facility] today for follow-up ankle fracture. Patient is now approximately 3 months status post right ankle fracture originally occurring on February 20. Clinically she is doing great with an ASO brace. She is advanced to active stability drills and physical therapy. 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    No past medical history on file. No past surgical history on file. Social History  Substance Use Topics  . Smoking status: Unknown If Ever Smoked  . Smokeless tobacco: Not on file  . Alcohol Use: Not on file   family history is not on file.  ROS as above:  Medications: Current Outpatient Prescriptions  Medication Sig Dispense Refill  . ibuprofen (ADVIL,MOTRIN) 800 MG tablet Take 1 tablet (800 mg total) by mouth every 8 (eight) hours as needed. 60 tablet 2   No current facility-administered medications for this visit.   No Known Allergies   Exam:  BP 130/81 mmHg  Pulse 80  Wt 192 lb (87.091 kg)  LMP 02/10/2016 Gen: Well NAD Right ankle normal-appearing nontender normal motion stable ligamentous exam  X-ray right ankle shows no apparent bone calcification or healing of the fracture of the fibula Awaiting Formal radiology review  No results found for this or any previous visit (from the past 24 hour(s)). No results found.    Assessment and Plan: 50 y.o. female with right ankle fracture. 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 Clinically she is doing great. I think she benefit from a second opinion with orthopedic surgery about benefits of surgery for this issue. Follow-up in 3 weeks. Continue current work restrictions.  Discussed warning signs or symptoms. Please see discharge instructions.  Patient expresses understanding.

## 2016-02-27 NOTE — Patient Instructions (Signed)
Thank you for coming in today. We will refer to orthopedics for a second opinion about the ankle.  Return in 3 weeks or so following the visit.  Continue PT.  Continue the ASO brace.

## 2016-02-27 NOTE — Therapy (Signed)
[facility] [place] [address] [place], [place], [address] Phone: [phone]   Fax:  [phone]  Physical Therapy Treatment  Patient Details  Name: Whitney Flores MRN: [id number] Date of Birth: Feb 11, 1966 Referring Provider: Dr Denyse Amass  Encounter Date: 02/27/2016      PT End of Session - 02/27/16 0937    Visit Number 10   Number of Visits 16   Date for PT Re-Evaluation 04/03/16   PT Start Time 0936   PT Stop Time 1032   PT Time Calculation (min) 56 min   Activity Tolerance Patient limited by pain      History reviewed. No pertinent past medical history.  History reviewed. No pertinent past surgical history.  There were no vitals filed for this visit.      Subjective Assessment - 02/27/16 0935    Subjective Just saw her MD, is sending her to an orthopedic MD for a second opinion because the bone is not calcifying the way it should be.    Currently in Pain? No/denies  only has pain with a lot of weightbearing activities.             [facility] PT Assessment - 02/27/16 0001    Assessment   Medical Diagnosis Rt ankle fracture - closed   Referring Provider Dr Denyse Amass   Onset Date/Surgical Date 11/20/15   Hand Dominance Right   Next MD Visit 02/27/16   AROM   AROM Assessment Site Ankle   Right Ankle Dorsiflexion 11   PROM   Right Ankle Dorsiflexion 15                     [facility] Adult PT Treatment/Exercise - 02/27/16 0001    Knee/Hip Exercises: Aerobic   Recumbent Bike L3: 5 min    Modalities   Modalities Electrical Stimulation;Vasopneumatic   Electrical Stimulation   Electrical Stimulation Location Rt ankle    Electrical Stimulation Action IFC   Electrical Stimulation Parameters to tolerance   Electrical Stimulation Goals Pain   Vasopneumatic   Number Minutes Vasopneumatic  15 minutes   Vasopnuematic Location  Ankle   Vasopneumatic Pressure Medium   Vasopneumatic Temperature  3 snowflakes   Ankle  Exercises: Standing   BAPS Standing;Level 4;15 reps  HHA PRN, circles, inv/ever   SLS 5x30sec Rt on blue disc   Ankle Exercises: Stretches   Plantar Fascia Stretch 30 seconds  on prostretch   Gastroc Stretch 30 seconds   Ankle Exercises: Plyometrics   Bilateral Jumping 15 reps  on rebounder, had some discomfort   Plyometric Exercises wt shifting with bouning on rebounder, no air   Ankle Exercises: Seated   Other Seated Ankle Exercises 3x10 eversion with blue band   Ankle Exercises: Supine   T-Band DF 3x15 with blue band                     PT Long Term Goals - 02/27/16 1005    PT LONG TERM GOAL #1   Title I with advanced HEP to include return to her normal exercise routine ( 04/03/16)    Time 10   Period Weeks   Status On-going   PT LONG TERM GOAL #2   Title increase Rt dorsiflexion =/> 15 degrees, ( 04/03/16)    Time 10   Period Weeks   Status On-going   PT LONG TERM GOAL #3   Title increase strength Rt LE =/> 5-/5 throughout ( 04/03/16)  Status Achieved   PT LONG TERM GOAL #4   Title improve strength for functional activites in Rt LE to equal Lt 04/03/16   Time 10   Period Weeks   Status On-going   PT LONG TERM GOAL #5   Title improve FOTO =/< 38% limited ( 04/03/16)    Time 10   Period Weeks   Status On-going               Plan - 02/27/16 1018    Clinical Impression Statement Whitney Flores continues to show progression with ROM and strength.  She does have pain with higher level resistance exercise of the ankle and with impact/light plyometrics.  This has limited some of her activity. Decreasing her frequency to 1x/wk as we await the second opionion per her conversation with current MD   Rehab Potential Excellent   PT Frequency 1x / week   PT Duration 4 weeks   PT Treatment/Interventions Ultrasound;Balance training;Neuromuscular re-education;Patient/family education;Passive range of motion;Gait training;Cryotherapy;Stair training;Dry needling;Electrical  Stimulation;Moist Heat;Therapeutic exercise;Manual techniques;Vasopneumatic Device;Taping   PT Next Visit Plan Continue progressive strengthening, stretching and proprioceptive activities for RLE.     Consulted and Agree with Plan of Care Patient      Patient will benefit from skilled therapeutic intervention in order to improve the following deficits and impairments:  Increased edema, Decreased strength, Hypomobility, Pain, Abnormal gait, Decreased range of motion  Visit Diagnosis: Pain in right ankle and joints of right foot  Other abnormalities of gait and mobility  Stiffness of right ankle, not elsewhere classified  Localized edema  Muscle weakness (generalized)     Problem List Patient Active Problem List   Diagnosis Date Noted  . Ankle fracture, right 11/21/2015    Roderic ScarceSusan Shaver PT 02/27/2016, 10:26 AM  [facility] [address] [place], [place], [address] Phone: [phone]   Fax:  [phone]  Name: Whitney Flores MRN: [id number] Date of Birth: 1966/01/23

## 2016-03-05 ENCOUNTER — Ambulatory Visit (INDEPENDENT_AMBULATORY_CARE_PROVIDER_SITE_OTHER): Payer: Worker's Compensation | Admitting: Physical Therapy

## 2016-03-05 DIAGNOSIS — M25671 Stiffness of right ankle, not elsewhere classified: Secondary | ICD-10-CM | POA: Diagnosis not present

## 2016-03-05 DIAGNOSIS — M25571 Pain in right ankle and joints of right foot: Secondary | ICD-10-CM | POA: Diagnosis not present

## 2016-03-05 NOTE — Therapy (Signed)
[place], [place], [address] Phone: [phone]   Fax:  [phone]  Physical Therapy Treatment  Patient Details  Name: Whitney Flores MRN: [id number] Date of Birth: 1966/07/09 Referring Provider: Dr. Georgina Snell   Encounter Date: 03/05/2016      PT End of Session - 03/05/16 0939    Visit Number 11   Number of Visits 16   Date for PT Re-Evaluation 04/03/16   PT Start Time 8590  pt arrived late   PT Stop Time 1028   PT Time Calculation (min) 53 min   Activity Tolerance Patient tolerated treatment well      No past medical history on file.  No past surgical history on file.  There were no vitals filed for this visit.      Subjective Assessment - 03/05/16 0938    Subjective Pt reports she is being referred to Dr. Berenice Primas for 2nd opinion on bone healing.  Otherwise, nothing new to report.    Patient Stated Goals no pain, workout and exercise - was doing squats, sliding side to side on machine    Currently in Pain? No/denies            [facility] PT Assessment - 03/05/16 0001    Assessment   Medical Diagnosis Rt ankle fracture - closed   Referring Provider Dr. Georgina Snell    Onset Date/Surgical Date 11/20/15   Hand Dominance Right   Next MD Visit 03/19/16         [facility] Adult PT Treatment/Exercise - 03/05/16 0001    Knee/Hip Exercises: Stretches   Gastroc Stretch Left;Right;20 seconds;4 reps   Soleus Stretch Right;Left;20 seconds;4 reps   Knee/Hip Exercises: Aerobic   Recumbent Bike L4: 5 min    Knee/Hip Exercises: Standing   SLS forward leans to 13" step x 10 each leg.    Other Standing Knee Exercises squats on upside down bosu x 10, repeated with 4# weighted ball in hands.    Other Standing Knee Exercises Forward shuffle x 20 ft x 4 reps, side shuffle x 25 ft x 6 reps; hop and stick x 25 ft x 2; 2 ft small hopping  (VC for soft landing) x 20 ft x 3 reps    Vasopneumatic   Number Minutes  Vasopneumatic  15 minutes   Vasopnuematic Location  Ankle   Vasopneumatic Pressure Medium   Vasopneumatic Temperature  3 snowflakes   Ankle Exercises: Standing   SLS 2 reps 30 sec on dynadisc   Other Standing Ankle Exercises Forward Step ups onto Bosu x 10 reps, lateral step ups to Bosu x 10 reps (pause 2 sec on Bosu)           PT Long Term Goals - 03/05/16 0955    PT LONG TERM GOAL #1   Title I with advanced HEP to include return to her normal exercise routine ( 04/03/16)    Time 10   Period Weeks   Status On-going   PT LONG TERM GOAL #2   Title increase Rt dorsiflexion =/> 15 degrees, ( 04/03/16)    Time 10   Period Weeks   Status Achieved   PT LONG TERM GOAL #3   Title increase strength Rt LE =/> 5-/5 throughout ( 04/03/16)    Time 10   Period Weeks   Status Achieved   PT LONG TERM GOAL #4   Title improve strength for functional activites in Rt LE to equal Lt 04/03/16  Time 10   Period Weeks   Status On-going   PT LONG TERM GOAL #5   Title improve FOTO =/< 38% limited ( 04/03/16)    Time 10   Period Weeks   Status On-going               Plan - 03/05/16 1014    Clinical Impression Statement Pt demonstrated improved Rt ankle ROM, has met LTG # 2. Pt tolerated exercises with minimal increase in discomfort in Rt ankle.   Making good gains towards goals with improved ability to perform proprioceptive and strengthening exercises.    Rehab Potential Excellent   PT Frequency 1x / week   PT Duration 4 weeks   PT Treatment/Interventions Ultrasound;Balance training;Neuromuscular re-education;Patient/family education;Passive range of motion;Gait training;Cryotherapy;Stair training;Dry needling;Electrical Stimulation;Moist Heat;Therapeutic exercise;Manual techniques;Vasopneumatic Device;Taping   PT Next Visit Plan Continue progressive strengthening, stretching and proprioceptive activities for RLE.     Consulted and Agree with Plan of Care Patient      Patient will benefit  from skilled therapeutic intervention in order to improve the following deficits and impairments:  Increased edema, Decreased strength, Hypomobility, Pain, Abnormal gait, Decreased range of motion  Visit Diagnosis: Pain in right ankle and joints of right foot  Stiffness of right ankle, not elsewhere classified     Problem List Patient Active Problem List   Diagnosis Date Noted  . Ankle fracture, right 11/21/2015   [name], PTA 03/05/2016 10:23 AM  [place], [place], [address] Phone: [phone]   Fax:  [phone]  Name: Whitney Flores MRN: [id number] Date of Birth: 02/11/1966

## 2016-03-12 ENCOUNTER — Encounter: Payer: Self-pay | Admitting: Physical Therapy

## 2016-03-12 ENCOUNTER — Ambulatory Visit (INDEPENDENT_AMBULATORY_CARE_PROVIDER_SITE_OTHER): Payer: Worker's Compensation | Admitting: Physical Therapy

## 2016-03-12 DIAGNOSIS — M25671 Stiffness of right ankle, not elsewhere classified: Secondary | ICD-10-CM

## 2016-03-12 DIAGNOSIS — R2689 Other abnormalities of gait and mobility: Secondary | ICD-10-CM

## 2016-03-12 DIAGNOSIS — M25571 Pain in right ankle and joints of right foot: Secondary | ICD-10-CM | POA: Diagnosis not present

## 2016-03-12 DIAGNOSIS — R600 Localized edema: Secondary | ICD-10-CM

## 2016-03-12 DIAGNOSIS — M6281 Muscle weakness (generalized): Secondary | ICD-10-CM

## 2016-03-12 NOTE — Therapy (Signed)
[facility] [place] [address] [place], [place], [address] Phone: [phone]   Fax:  [phone]  Physical Therapy Treatment  Patient Details  Name: Whitney Flores MRN: [id number] Date of Birth: 03-13-1966 Referring Provider: Dr. Denyse Amass   Encounter Date: 03/12/2016      PT End of Session - 03/12/16 0804    Visit Number 12   Number of Visits 16   Date for PT Re-Evaluation 04/03/16   PT Start Time 0802   PT Stop Time 0856   PT Time Calculation (min) 54 min   Activity Tolerance Patient tolerated treatment well;No increased pain      History reviewed. No pertinent past medical history.  History reviewed. No pertinent past surgical history.  There were no vitals filed for this visit.      Subjective Assessment - 03/12/16 0805    Subjective Patient reports that she has an appt with an orthopedist for a second opinion on 03/26/16. She will postpone her appt with Dr. Denyse Amass until after the second opinion. She has not tried exercise video yet, slightly worried about jumping components.    Currently in Pain? No/denies            [facility] PT Assessment - 03/12/16 0001    Assessment   Medical Diagnosis Rt ankle fracture - closed          [facility] Adult PT Treatment/Exercise - 03/12/16 0001    Knee/Hip Exercises: Stretches   Passive Hamstring Stretch Right;3 reps;30 seconds   Quad Stretch Right;2 reps;30 seconds;Left   Gastroc Stretch Left;Right;20 seconds;4 reps   Soleus Stretch Right;Left;2 reps;30 seconds   Knee/Hip Exercises: Aerobic   Recumbent Bike L4: 5 min    Knee/Hip Exercises: Standing   Lateral Step Up Right;1 set;Hand Hold: 0  13" step   Forward Step Up Right;1 set;15 reps  13" step    Lunge Walking - Round Trips 20 ft with 4# weight in hands     SLS forward leans to 13" step x 10 each leg;  Rt/ Lt SLS on upside down Bosu x 30 sec x 2 trials each leg. repeated 1 trial with horizontal head turns (challenging)    Other Standing Knee Exercises side to side curtsy jumps 30 sec x 3 trials    Vasopneumatic   Number Minutes Vasopneumatic  15 minutes   Vasopnuematic Location  Ankle   Vasopneumatic Pressure Medium   Vasopneumatic Temperature  3 snowflakes   Ankle Exercises: Standing   Heel Raises 10 reps  toes in, out, straight.(each)           PT Long Term Goals - 03/05/16 0955    PT LONG TERM GOAL #1   Title I with advanced HEP to include return to her normal exercise routine ( 04/03/16)    Time 10   Period Weeks   Status On-going   PT LONG TERM GOAL #2   Title increase Rt dorsiflexion =/> 15 degrees, ( 04/03/16)    Time 10   Period Weeks   Status Achieved   PT LONG TERM GOAL #3   Title increase strength Rt LE =/> 5-/5 throughout ( 04/03/16)    Time 10   Period Weeks   Status Achieved   PT LONG TERM GOAL #4   Title improve strength for functional activites in Rt LE to equal Lt 04/03/16   Time 10   Period Weeks   Status On-going   PT LONG TERM GOAL #5   Title improve  FOTO =/< 38% limited ( 04/03/16)    Time 10   Period Weeks   Status On-going               Plan - 03/12/16 0843    Clinical Impression Statement Improving with light agility exercises; tolerated these exercises without brace, without increase in pain.  Rt hip strength improving with functional activities.  Making great gains towards remaining goals.    Rehab Potential Excellent   PT Frequency 1x / week   PT Duration 4 weeks   PT Treatment/Interventions Ultrasound;Balance training;Neuromuscular re-education;Patient/family education;Passive range of motion;Gait training;Cryotherapy;Stair training;Dry needling;Electrical Stimulation;Moist Heat;Therapeutic exercise;Manual techniques;Vasopneumatic Device;Taping   PT Next Visit Plan Continue progressive strengthening, stretching and proprioceptive activities for RLE.        Patient will benefit from skilled therapeutic intervention in order to improve the following deficits  and impairments:  Increased edema, Decreased strength, Hypomobility, Pain, Abnormal gait, Decreased range of motion  Visit Diagnosis: Pain in right ankle and joints of right foot  Stiffness of right ankle, not elsewhere classified  Other abnormalities of gait and mobility  Localized edema  Muscle weakness (generalized)     Problem List Patient Active Problem List   Diagnosis Date Noted  . Ankle fracture, right 11/21/2015   Mayer CamelJennifer Carlson-Long, PTA 03/12/2016 10:11 AM  [facility] [address] [place], [place], [address] Phone: [phone]   Fax:  [phone]  Name: Whitney Flores MRN: [id number] Date of Birth: [birth year]

## 2016-03-14 ENCOUNTER — Ambulatory Visit (INDEPENDENT_AMBULATORY_CARE_PROVIDER_SITE_OTHER): Payer: Worker's Compensation | Admitting: Physical Therapy

## 2016-03-14 DIAGNOSIS — R2689 Other abnormalities of gait and mobility: Secondary | ICD-10-CM | POA: Diagnosis not present

## 2016-03-14 DIAGNOSIS — M25671 Stiffness of right ankle, not elsewhere classified: Secondary | ICD-10-CM | POA: Diagnosis not present

## 2016-03-14 NOTE — Therapy (Signed)
[place], [place], [address] Phone: [phone]   Fax:  [phone]  Physical Therapy Treatment  Patient Details  Name: Whitney Flores MRN: [id number] Date of Birth: 07/15/1966 Referring Provider: Dr. Georgina Snell   Encounter Date: 03/14/2016      PT End of Session - 03/14/16 0909    Visit Number 13   Number of Visits 16   Date for PT Re-Evaluation 04/03/16   PT Start Time 4709  pt arrived late   PT Stop Time 0939   PT Time Calculation (min) 49 min   Activity Tolerance Patient tolerated treatment well;No increased pain      No past medical history on file.  No past surgical history on file.  There were no vitals filed for this visit.      Subjective Assessment - 03/14/16 0907    Subjective Pt reports no new changes.    Currently in Pain? No/denies            [facility] PT Assessment - 03/14/16 0001    Assessment   Medical Diagnosis Rt ankle fracture - closed   Referring Provider Dr. Georgina Snell    Onset Date/Surgical Date 11/20/15   Hand Dominance Right   Next MD Visit after 03/26/16 appt with Dr. Sharmon Leyden Adult PT Treatment/Exercise - 03/14/16 0001    Knee/Hip Exercises: Stretches   Quad Stretch 2 reps;30 seconds;Left;Right   Gastroc Stretch Left;Right;20 seconds;4 reps   Soleus Stretch Right;Left;2 reps;30 seconds   Knee/Hip Exercises: Aerobic   Recumbent Bike L3-4: 5 min    Knee/Hip Exercises: Standing   Step Down 10 reps;Left;1 set;Hand Hold: 0;Step Height: 6"  and step up backwards with Rt.    Lunge Walking - Round Trips 50 ft x 2 sets    SLS forward leans to 13" step x 10 each leg;  Rt/ Lt SLS on upside down Bosu x 30 sec x 2 trials each leg. repeated 1 trial with horizontal head turns (challenging)    Other Standing Knee Exercises side to side curtsy jumps 30 sec x 2 trials, 45 sec x 1 trial.     Vasopneumatic   Number Minutes Vasopneumatic  15  minutes   Vasopnuematic Location  Ankle   Vasopneumatic Pressure Medium   Vasopneumatic Temperature  3 snowflakes          PT Long Term Goals - 03/14/16 6283    PT LONG TERM GOAL #1   Title I with advanced HEP to include return to her normal exercise routine ( 04/03/16)    Time 10   Period Weeks   Status On-going   PT LONG TERM GOAL #2   Title increase Rt dorsiflexion =/> 15 degrees, ( 04/03/16)    Time 10   Period Weeks   Status Achieved   PT LONG TERM GOAL #3   Title increase strength Rt LE =/> 5-/5 throughout ( 04/03/16)    Time 10   Period Weeks   Status Achieved   PT LONG TERM GOAL #4   Title improve strength for functional activites in Rt LE to equal Lt 04/03/16   Time 10   Period Weeks   Status Achieved   PT LONG TERM GOAL #5   Title improve FOTO =/< 38% limited ( 04/03/16)    Time 10   Period Weeks   Status On-going  Plan - 03/14/16 0927    Clinical Impression Statement Pt tolerated proprioception and agility exercise with increased time and difficulty without increase in pain/ symptoms.   Pt has met LTG # 4.     Rehab Potential Excellent   PT Frequency 1x / week   PT Duration 4 weeks   PT Treatment/Interventions Ultrasound;Balance training;Neuromuscular re-education;Patient/family education;Passive range of motion;Gait training;Cryotherapy;Stair training;Dry needling;Electrical Stimulation;Moist Heat;Therapeutic exercise;Manual techniques;Vasopneumatic Device;Taping   PT Next Visit Plan Continue progressive strengthening, stretching and proprioceptive activities for RLE.  Assess goals and readiness for d/c to HEP.  Pt to see Dr. Berenice Primas on 03/26/16.    Consulted and Agree with Plan of Care Patient      Patient will benefit from skilled therapeutic intervention in order to improve the following deficits and impairments:  Increased edema, Decreased strength, Hypomobility, Pain, Abnormal gait, Decreased range of motion  Visit Diagnosis: Stiffness of  right ankle, not elsewhere classified  Other abnormalities of gait and mobility     Problem List Patient Active Problem List   Diagnosis Date Noted  . Ankle fracture, right 11/21/2015   [name], PTA 03/14/2016 9:40 AM  [facility] [place], [place], [address] Phone: [phone]   Fax:  [phone]  Name: Whitney Flores MRN: [id number] Date of Birth: Aug 21, 1966

## 2016-03-19 ENCOUNTER — Ambulatory Visit: Payer: Self-pay | Admitting: Family Medicine

## 2016-03-19 ENCOUNTER — Ambulatory Visit (INDEPENDENT_AMBULATORY_CARE_PROVIDER_SITE_OTHER): Payer: Worker's Compensation | Admitting: Physical Therapy

## 2016-03-19 DIAGNOSIS — R2689 Other abnormalities of gait and mobility: Secondary | ICD-10-CM

## 2016-03-19 DIAGNOSIS — M25671 Stiffness of right ankle, not elsewhere classified: Secondary | ICD-10-CM

## 2016-03-19 DIAGNOSIS — M25571 Pain in right ankle and joints of right foot: Secondary | ICD-10-CM

## 2016-03-19 NOTE — Therapy (Addendum)
[place], [place], [address] Phone: [phone]   Fax:  [phone]  Physical Therapy Treatment  Patient Details  Name: Whitney Flores MRN: [id number] Date of Birth: 1966/08/11 Referring Provider: Dr. Georgina Snell  Encounter Date: 03/19/2016      PT End of Session - 03/19/16 0817    Visit Number 14   Number of Visits 16   Date for PT Re-Evaluation 04/03/16   PT Start Time 0804   PT Stop Time 0904   PT Time Calculation (min) 60 min   Activity Tolerance Patient tolerated treatment well;No increased pain      No past medical history on file.  No past surgical history on file.  There were no vitals filed for this visit.      Subjective Assessment - 03/19/16 0817    Subjective Pt reports she went to gym to workout with friend and completed a circuit workout, walked on treadmill 3-5% incline/ speed of 3-4.0 mph - all without brace and without pain.  However, mowing her lawn ~3 hours (push mower, yard with slight incline) caused some mild pain (2/10 ache) after finishing .   Patient Stated Goals no pain, workout and exercise - was doing squats, sliding side to side on machine    Currently in Pain? No/denies            [facility] PT Assessment - 03/19/16 0001    Assessment   Medical Diagnosis Rt ankle fracture - closed   Referring Provider Dr. Georgina Snell   Onset Date/Surgical Date 11/20/15   Hand Dominance Right   Next MD Visit after 03/26/16 appt with Dr. Berenice Primas   Observation/Other Assessments   Focus on Therapeutic Outcomes (FOTO)  30% limited.  (Intake 63% limted, goal of 38% limited)   AROM   AROM Assessment Site Ankle   Right/Left Ankle Right   Right Ankle Dorsiflexion 11   Right Ankle Plantar Flexion --  WNL   Right Ankle Inversion --  WNL   Right Ankle Eversion --  WNL   PROM   Right/Left Ankle Right   Right Ankle Dorsiflexion 15   Strength   Right/Left Ankle Right   Right Ankle  Dorsiflexion 5/5   Right Ankle Plantar Flexion 5/5   Right Ankle Inversion 5/5   Right Ankle Eversion 5/5          [facility] Adult PT Treatment/Exercise - 03/19/16 0001    Knee/Hip Exercises: Stretches   Sports administrator 2 reps;30 seconds;Left;Right   Press photographer Left;Right;20 seconds;4 reps   Soleus Stretch Right;Left;2 reps;30 seconds   Knee/Hip Exercises: Aerobic   Recumbent Bike L3-4: 5 min    Knee/Hip Exercises: Plyometrics   Bilateral Jumping 2 sets;10 reps  (forward jumps)   Bilateral Jumping Limitations VC for soft landing   Knee/Hip Exercises: Standing   SLS RLE on Bosu with unilateral arm motions x 30 sec x 2; RLE stance on Bosu with LLE front/back swings and LLE hip abduct/ add.    Other Standing Knee Exercises Fitter side to side with 1 black, 2 blue bands x 10 reps x 2 sets.     Vasopneumatic   Number Minutes Vasopneumatic  15 minutes   Vasopnuematic Location  Ankle   Vasopneumatic Pressure Medium   Vasopneumatic Temperature  3 snowflakes   Ankle Exercises: Standing   BAPS Standing;Level 4;15 reps  HHA PRN, circles, inv/ever, PF/DF          PT Long Term Goals - 03/19/16  0917    PT LONG TERM GOAL #1   Title I with advanced HEP to include return to her normal exercise routine ( 04/03/16)    Time 10   Period Weeks   Status Achieved   PT LONG TERM GOAL #2   Title increase Rt dorsiflexion =/> 15 degrees, ( 04/03/16)    Time 10   Period Weeks   Status Achieved   PT LONG TERM GOAL #3   Title increase strength Rt LE =/> 5-/5 throughout ( 04/03/16)    Time 10   Period Weeks   Status Achieved   PT LONG TERM GOAL #4   Title improve strength for functional activites in Rt LE to equal Lt 04/03/16   Time 10   Period Weeks   Status Achieved   PT LONG TERM GOAL #5   Title improve FOTO =/< 38% limited ( 04/03/16)    Time 10   Period Weeks   Status Achieved               Plan - 03/19/16 0825    Clinical Impression Statement Pt reported increased ache in Rt ankle  with BAPS board and SLS proprioception activities (up to 2/10); this was eliminated with rest and vaso at end of treatment.. Pt has met remaining goals and states she feels ready to d/c to HEP.    Rehab Potential Excellent   PT Frequency 1x / week   PT Duration 4 weeks   PT Treatment/Interventions Ultrasound;Balance training;Neuromuscular re-education;Patient/family education;Passive range of motion;Gait training;Cryotherapy;Stair training;Dry needling;Electrical Stimulation;Moist Heat;Therapeutic exercise;Manual techniques;Vasopneumatic Device;Taping   PT Next Visit Plan Spoke to supervising PT; will d/c to HEP at this time.    Consulted and Agree with Plan of Care Patient      Patient will benefit from skilled therapeutic intervention in order to improve the following deficits and impairments:  Increased edema, Decreased strength, Hypomobility, Pain, Abnormal gait, Decreased range of motion  Visit Diagnosis: Stiffness of right ankle, not elsewhere classified  Other abnormalities of gait and mobility  Pain in right ankle and joints of right foot     Problem List Patient Active Problem List   Diagnosis Date Noted  . Ankle fracture, right 11/21/2015   Whitney Flores, PTA 03/19/2016 9:22 AM  [name], [place], [address] Phone: [phone]   Fax:  [phone]  Name: Whitney Flores MRN: [id number] Date of Birth: 09/25/1966    PHYSICAL THERAPY DISCHARGE SUMMARY  Visits from Start of Care: 14  Current functional level related to goals / functional outcomes: See above, all goals met, patient has been able to return to the gym. She returns to MD next week.    Remaining deficits: none   Education / Equipment: HEP Plan: Patient agrees to discharge.  Patient goals were met. Patient is being discharged due to meeting the stated rehab goals.  ?????   Jeral Pinch, PT 03/20/2016 7:15  AM

## 2016-03-21 ENCOUNTER — Encounter: Payer: Self-pay | Admitting: Physical Therapy

## 2016-03-29 ENCOUNTER — Ambulatory Visit (INDEPENDENT_AMBULATORY_CARE_PROVIDER_SITE_OTHER): Payer: Worker's Compensation | Admitting: Family Medicine

## 2016-03-29 VITALS — BP 109/76 | HR 78 | Wt 191.0 lb

## 2016-03-29 DIAGNOSIS — S82891A Other fracture of right lower leg, initial encounter for closed fracture: Secondary | ICD-10-CM

## 2016-03-29 NOTE — Progress Notes (Signed)
       Whitney Flores is a 50 y.o. female who [facility]: Primary Care Sports Medicine today for follow-up ankle fracture. Patient has been seen many times for a fracture involving her right ankle. This is taken a very long time to heal radiographically. Clinically she is doing great and is doing all sorts of home activities that are consistent to what she would be doing homework. She feels as though she is ready to go back to work full duties. She's been seen by orthopedics recently and per her report notes that the x-ray showed a healed fracture.   No past medical history on file. No past surgical history on file. Social History  Substance Use Topics  . Smoking status: Unknown If Ever Smoked  . Smokeless tobacco: Not on file  . Alcohol Use: Not on file   family history is not on file.  ROS as above:  Medications: Current Outpatient Prescriptions  Medication Sig Dispense Refill  . ibuprofen (ADVIL,MOTRIN) 800 MG tablet Take 1 tablet (800 mg total) by mouth every 8 (eight) hours as needed. 60 tablet 2   No current facility-administered medications for this visit.   No Known Allergies   Exam:  BP 109/76 mmHg  Pulse 78  Wt 191 lb (86.637 kg) Gen: Well NAD Right ankle is normal appearing and nontender. Normal gait.  No results found for this or any previous visit (from the past 24 hour(s)). No results found.    Assessment and Plan: 50 y.o. female with clinically healed right ankle fracture. Resume work full duties. Return as needed.  Discussed warning signs or symptoms. Please see discharge instructions. Patient expresses understanding.

## 2016-06-24 ENCOUNTER — Telehealth: Payer: Self-pay | Admitting: Family Medicine

## 2016-06-24 NOTE — Telephone Encounter (Signed)
I received a form from the insurance company asking me to determine permanent disability at maximum medical improvement. Please schedule an appointment with me in the near future so that we can fill out this form together to make sure it is 100% accurate.

## 2016-06-25 IMAGING — CR DG ANKLE COMPLETE 3+V*R*
3 series · 3 of 3 positions shown · non-contrast
Comparison: December 12, 2015

CLINICAL DATA: Follow-up right ankle fracture.

EXAM:
RIGHT ANKLE - COMPLETE 3+ VIEW

[ankle ap]
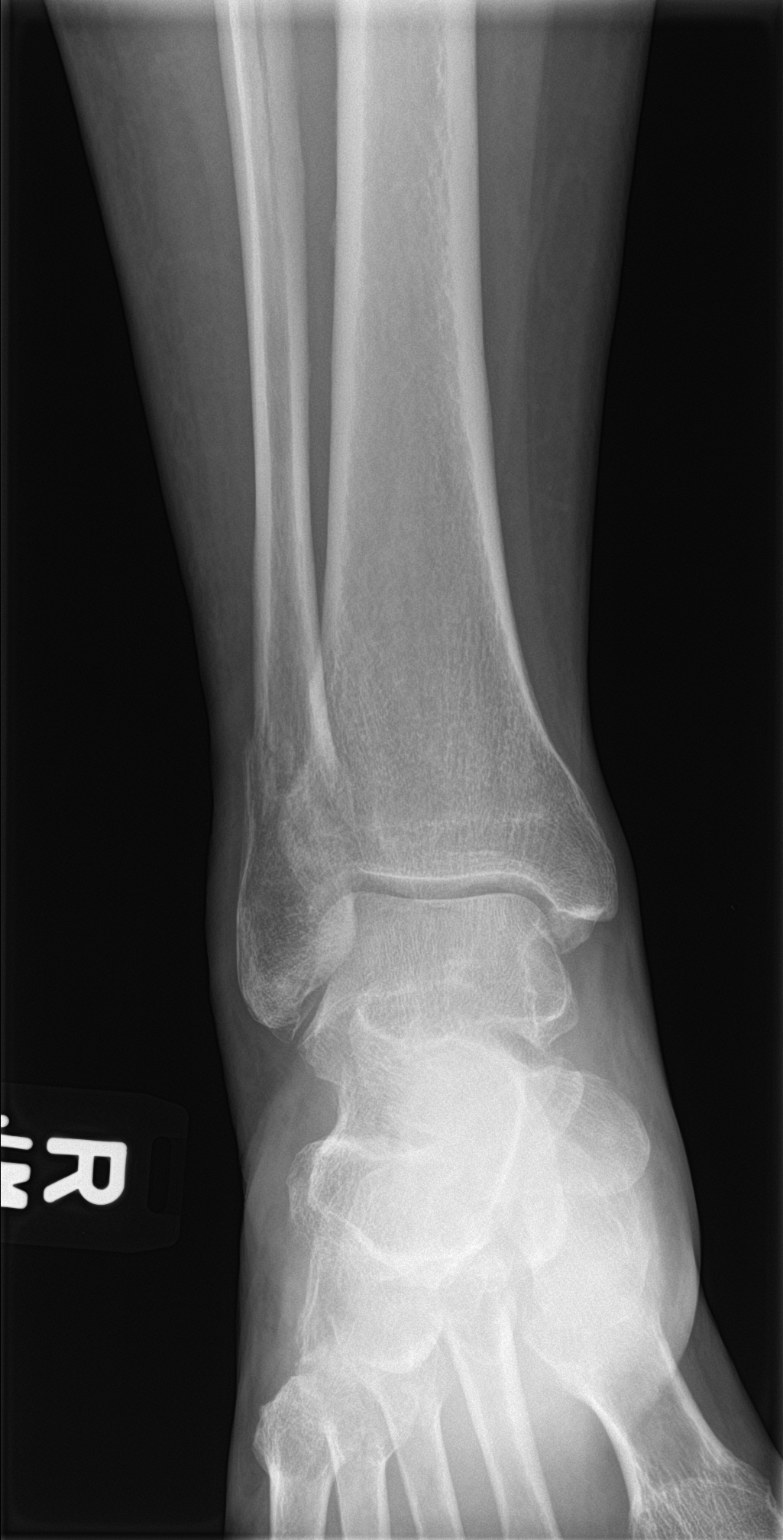

[ankle obl]
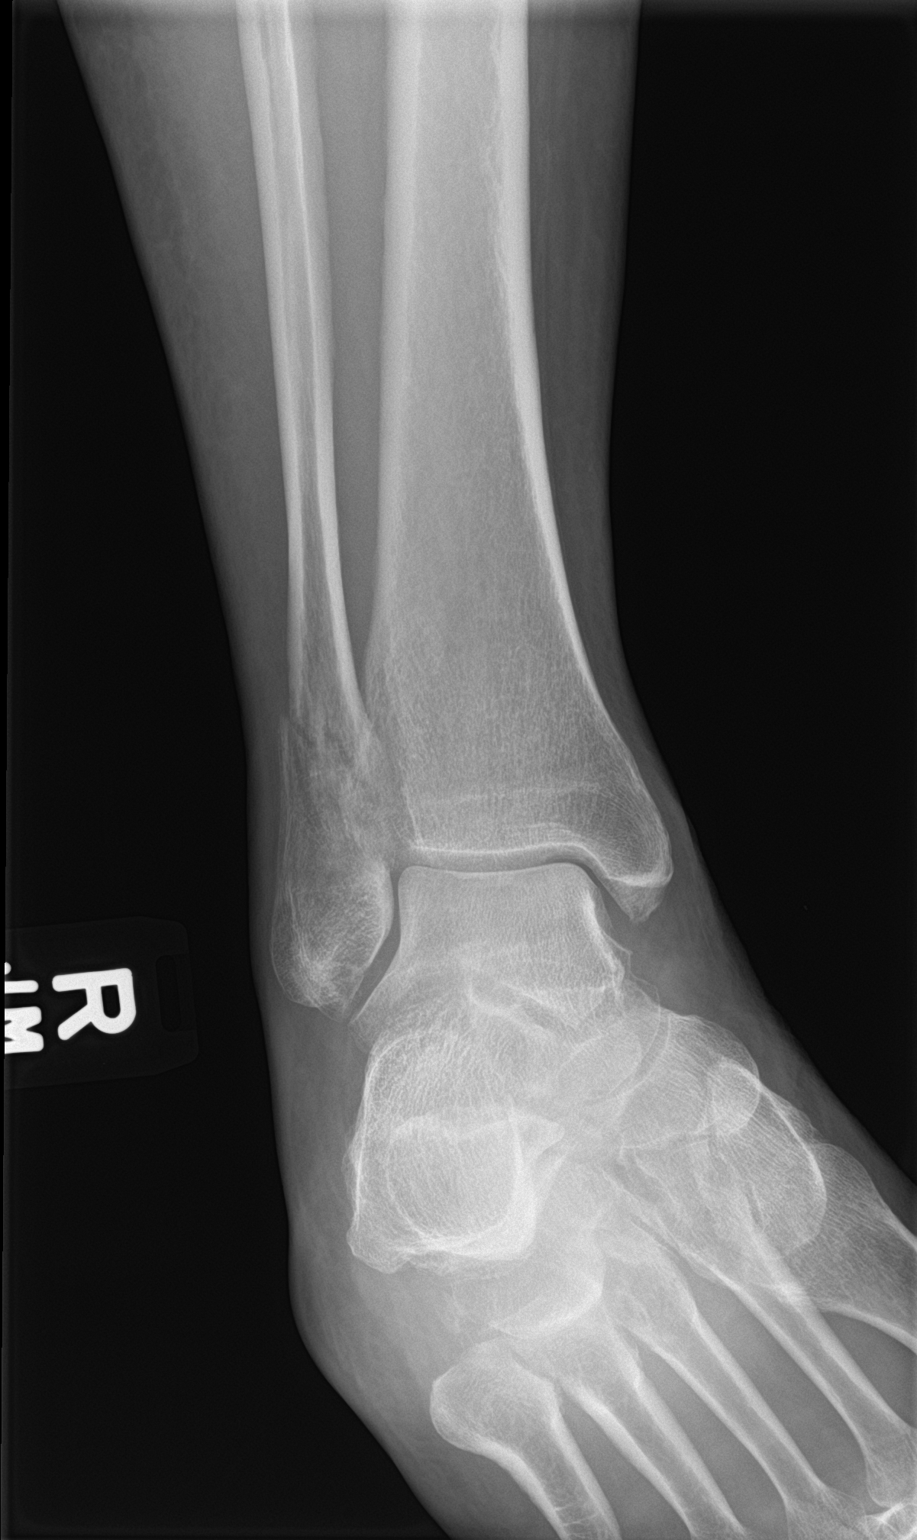

[ankle lat]
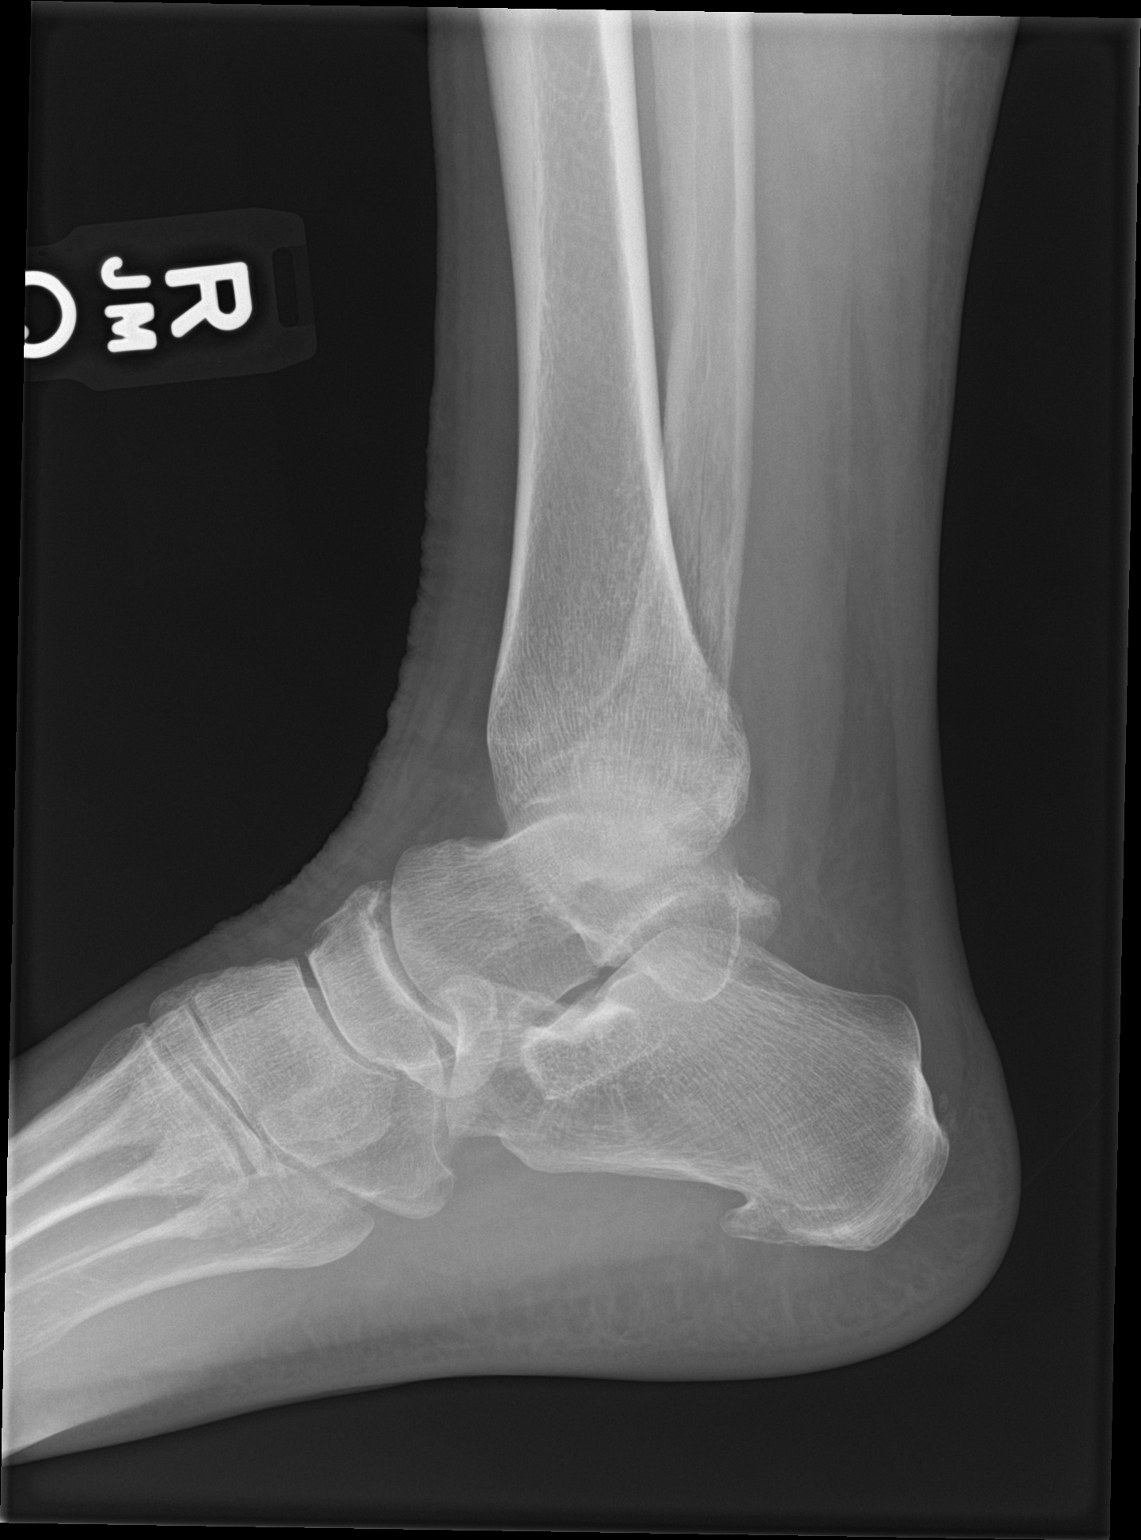

[3 of 3 positions shown; findings below may reference images not displayed]

FINDINGS: There is displaced fracture of the distal fibula not significantly
changed compared to prior exam. There is no interval significant
increase of callus formation. There is no dislocation.
IMPRESSION: Displaced fracture of distal fibula not significantly changed
compared to prior exam.

## 2016-06-26 NOTE — Telephone Encounter (Signed)
Left detailed vm requesting pt call and schedule an appointment to complete form.

## 2016-07-04 ENCOUNTER — Encounter: Payer: Self-pay | Admitting: Family Medicine

## 2016-07-04 ENCOUNTER — Ambulatory Visit (INDEPENDENT_AMBULATORY_CARE_PROVIDER_SITE_OTHER): Payer: Worker's Compensation | Admitting: Family Medicine

## 2016-07-04 VITALS — BP 122/83 | HR 74 | Wt 182.0 lb

## 2016-07-04 DIAGNOSIS — S82891K Other fracture of right lower leg, subsequent encounter for closed fracture with nonunion: Secondary | ICD-10-CM | POA: Diagnosis not present

## 2016-07-05 NOTE — Progress Notes (Signed)
       Whitney CoveyGina Flores is a 50 y.o. female who [facility] [place] today for Patient presents to clinic today for finalization of her right ankle fracture.  She was originally seen in February for fracture that occurred at work. She had a long course with delayed healing on x-ray. She has been released to return to work now since June 30. She notes continued soreness and pain in the right ankle but overall feels pretty well. She is slightly limited in her activities and cannot walk quite as far or do as much exercise as she would like however she has resumed almost all activities. Overall she feels pretty well but only mild pain and soreness.    No past medical history on file. No past surgical history on file. Social History  Substance Use Topics  . Smoking status: Unknown If Ever Smoked  . Smokeless tobacco: Not on file  . Alcohol use Not on file   family history is not on file.  ROS as above:  Medications: Current Outpatient Prescriptions  Medication Sig Dispense Refill  . ibuprofen (ADVIL,MOTRIN) 800 MG tablet Take 1 tablet (800 mg total) by mouth every 8 (eight) hours as needed. 60 tablet 2   No current facility-administered medications for this visit.    No Known Allergies   Exam:  BP 122/83   Pulse 74   Wt 182 lb (82.6 kg)   BMI 30.29 kg/m  Gen: Well NAD Right ankle: Slightly swollen nontender right lateral ankle.  X-ray right ankle from 02/27/2016 reviewed this was the last x-ray obtained showing fibrous union/nonunion  No results found for this or any previous visit (from the past 24 hour(s)). No results found.    Assessment and Plan: 50 y.o. 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 Form filled out for work with maximal medical improvement achieved.  10%  disability with the right foot and ankle due to persistent fibrous union fracture that slightly limits activity.  Return as needed.   No orders of the defined types were placed in this encounter.   Discussed warning signs or symptoms. Please see discharge instructions. Patient expresses understanding.

## 2016-08-13 IMAGING — DX DG ANKLE COMPLETE 3+V*R*
3 series · 3 of 3 positions shown · non-contrast
Comparison: 01/19/2016

CLINICAL DATA: Patient here for f/u right ankle fracture, states
that she is doing better but has still has some pains with weight
bearing

EXAM:
RIGHT ANKLE - COMPLETE 3+ VIEW

[ankle ap]
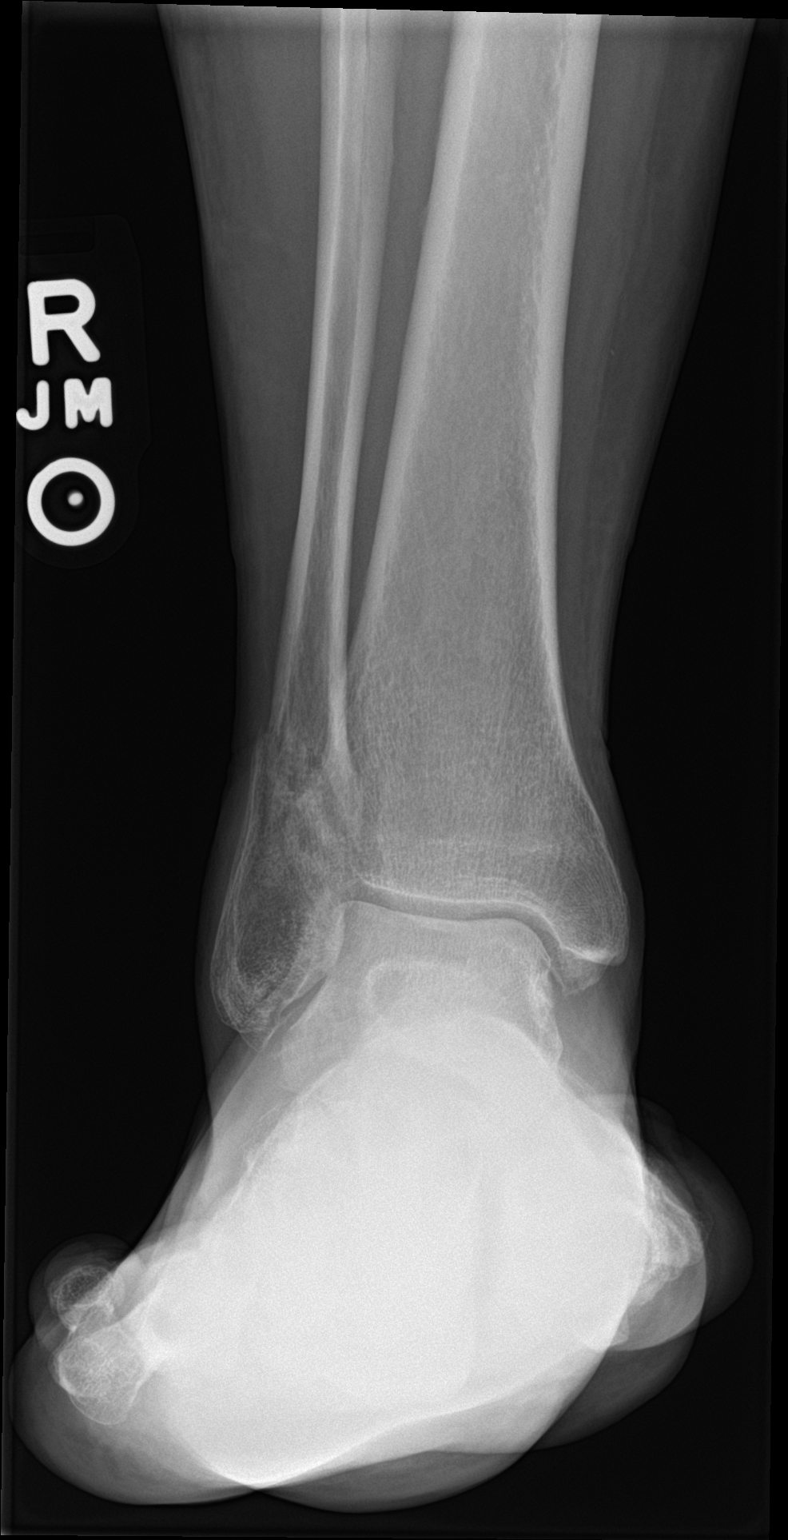

[ankle obl]
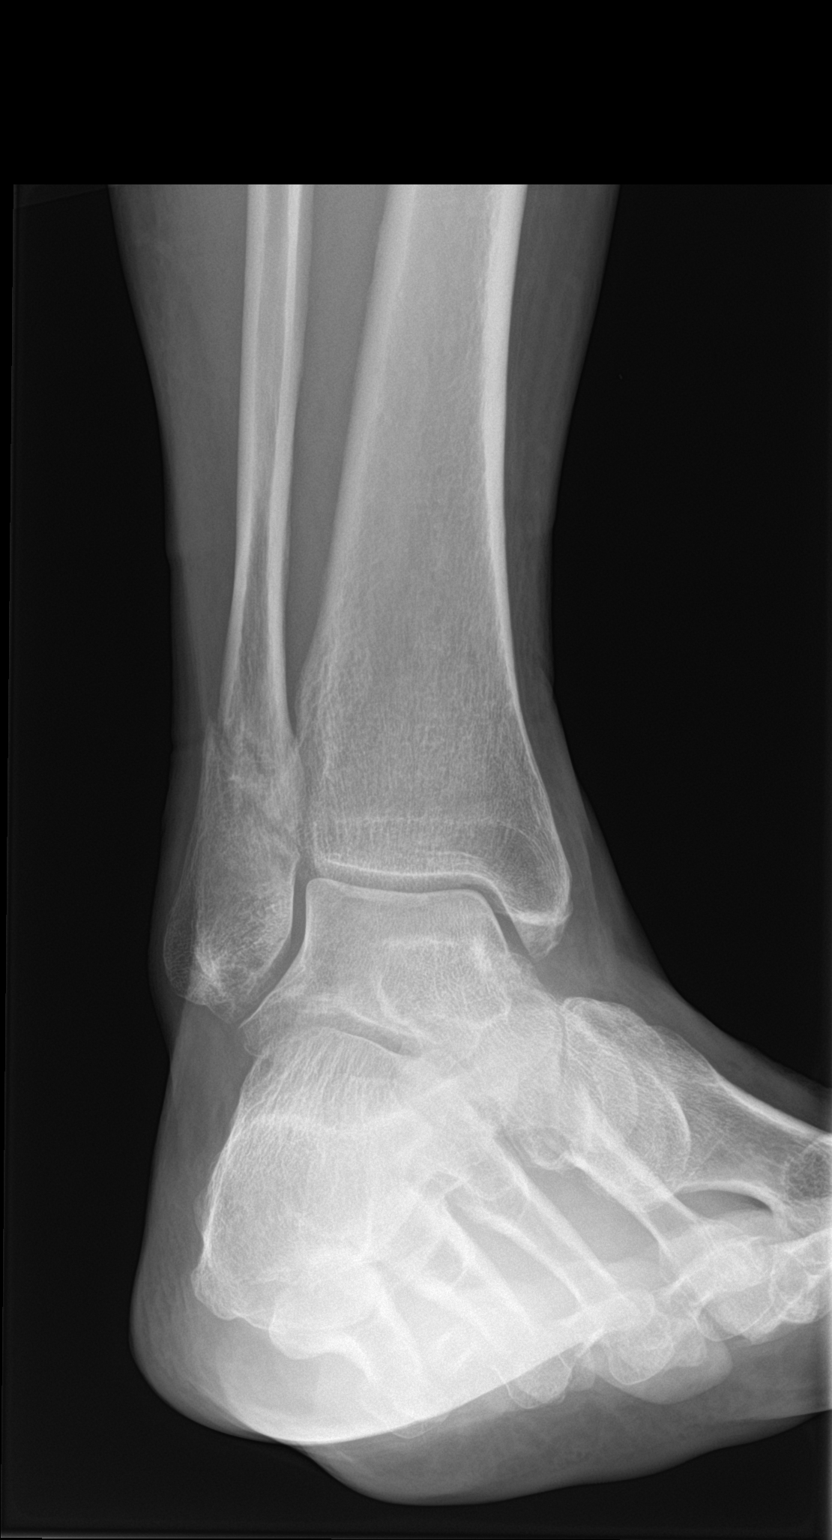

[ankle lat]
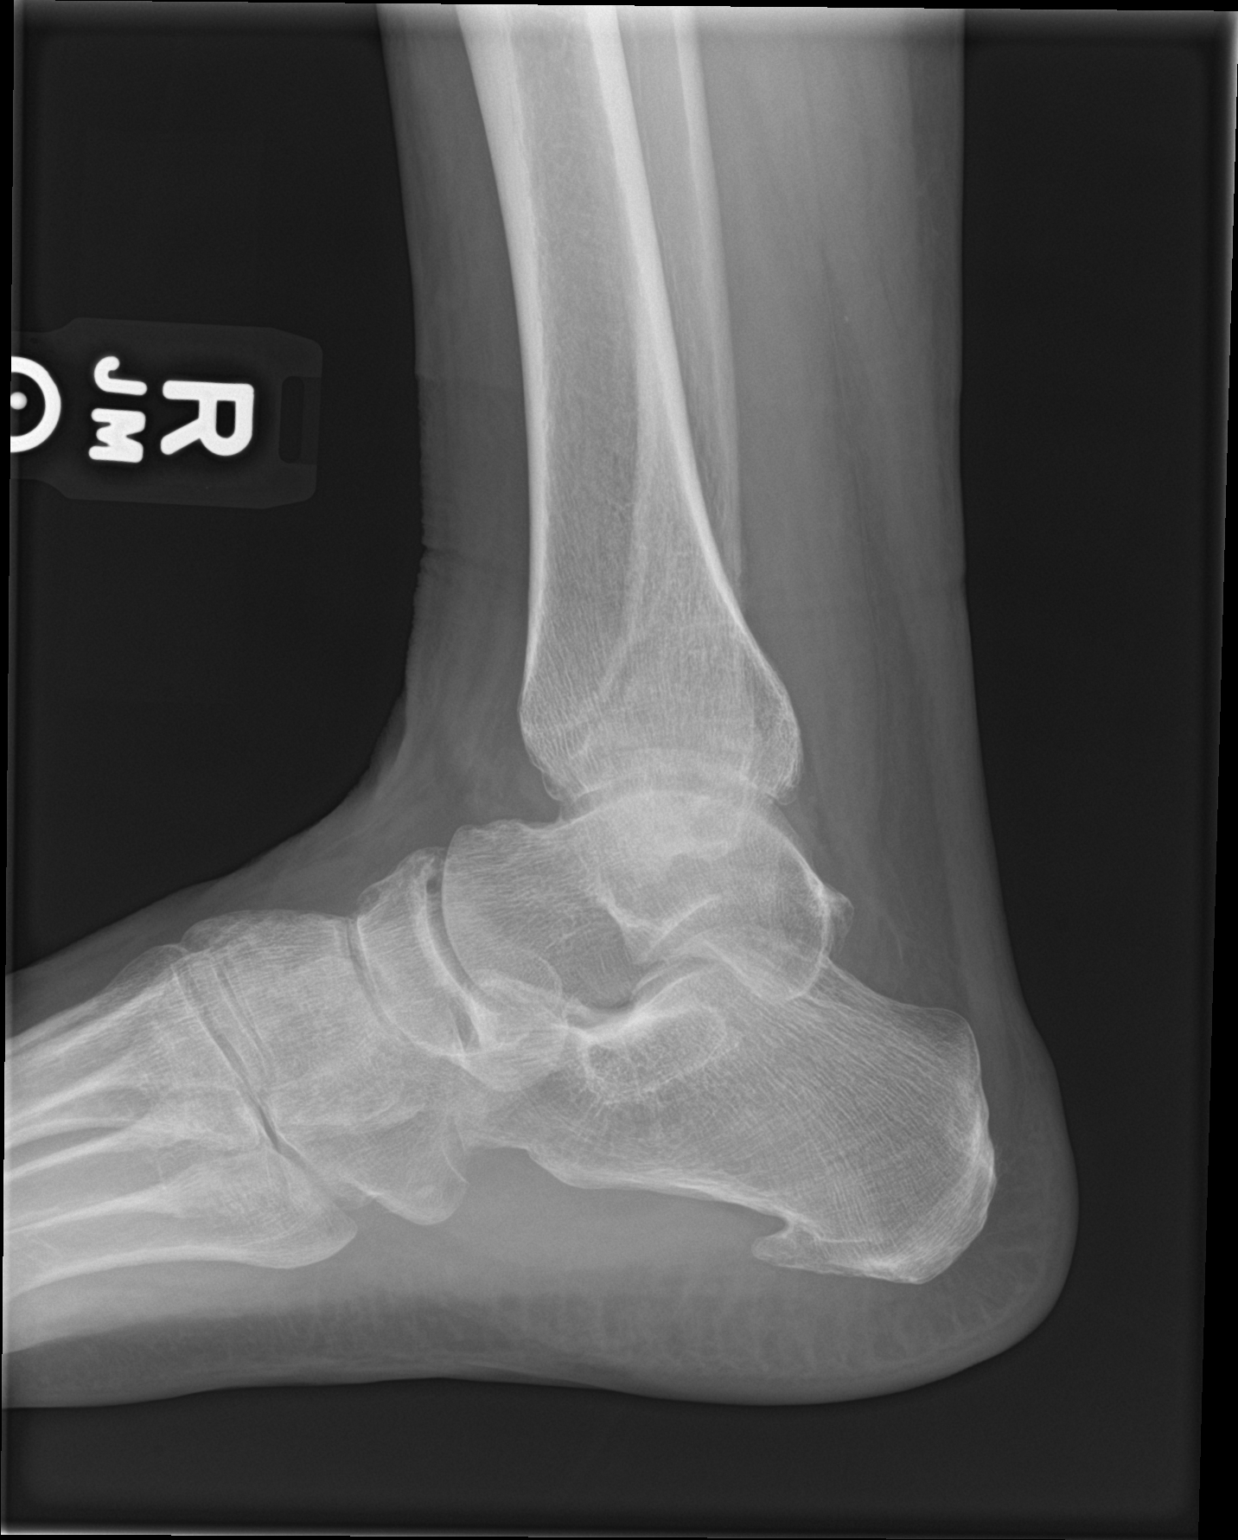

[3 of 3 positions shown; findings below may reference images not displayed]

FINDINGS: The oblique fracture of the distal fibula demonstrates mild
resorption along the fracture line, but no bony bridging or callus
formation.

There is no fracture displacement or angulation. The ankle mortise
is normally spaced and aligned. There are no other fractures.

Moderate plantar calcaneal spur.
IMPRESSION: 1. Mild resorption along the fracture line of the distal fibula, but
no other evidence of healing. No callus formation or bony bridging.
2. No fracture displacement or angulation. Normally aligned ankle
joint.
